# Patient Record
Sex: Female | Born: 1980 | Race: Black or African American | Hispanic: No | Marital: Single | Smoking: Former smoker
Health system: Southern US, Community
[De-identification: ages and names within clinical notes are randomized; demographics above are authoritative.]

## PROBLEM LIST (undated history)

## (undated) DIAGNOSIS — J45909 Unspecified asthma, uncomplicated: Secondary | ICD-10-CM

## (undated) DIAGNOSIS — I10 Essential (primary) hypertension: Secondary | ICD-10-CM

## (undated) DIAGNOSIS — E785 Hyperlipidemia, unspecified: Secondary | ICD-10-CM

## (undated) DIAGNOSIS — I340 Nonrheumatic mitral (valve) insufficiency: Secondary | ICD-10-CM

## (undated) DIAGNOSIS — K219 Gastro-esophageal reflux disease without esophagitis: Secondary | ICD-10-CM

## (undated) DIAGNOSIS — R87619 Unspecified abnormal cytological findings in specimens from cervix uteri: Secondary | ICD-10-CM

## (undated) DIAGNOSIS — R0600 Dyspnea, unspecified: Secondary | ICD-10-CM

## (undated) DIAGNOSIS — A599 Trichomoniasis, unspecified: Secondary | ICD-10-CM

## (undated) DIAGNOSIS — I519 Heart disease, unspecified: Secondary | ICD-10-CM

## (undated) DIAGNOSIS — R519 Headache, unspecified: Secondary | ICD-10-CM

## (undated) HISTORY — DX: Dyspnea, unspecified: R06.00

## (undated) HISTORY — DX: Unspecified abnormal cytological findings in specimens from cervix uteri: R87.619

## (undated) HISTORY — DX: Hyperlipidemia, unspecified: E78.5

## (undated) HISTORY — DX: Trichomoniasis, unspecified: A59.9

## (undated) HISTORY — PX: CHOLECYSTECTOMY: SHX55

## (undated) HISTORY — DX: Essential (primary) hypertension: I10

## (undated) HISTORY — DX: Unspecified asthma, uncomplicated: J45.909

## (undated) HISTORY — DX: Nonrheumatic mitral (valve) insufficiency: I34.0

## (undated) HISTORY — DX: Heart disease, unspecified: I51.9

---

## 2003-10-31 ENCOUNTER — Inpatient Hospital Stay (HOSPITAL_COMMUNITY): Admission: AD | Admit: 2003-10-31 | Discharge: 2003-11-02 | Payer: Self-pay | Admitting: Obstetrics & Gynecology

## 2013-08-24 DIAGNOSIS — E782 Mixed hyperlipidemia: Secondary | ICD-10-CM | POA: Insufficient documentation

## 2013-08-24 DIAGNOSIS — Z720 Tobacco use: Secondary | ICD-10-CM | POA: Insufficient documentation

## 2013-09-25 ENCOUNTER — Ambulatory Visit: Payer: Self-pay | Admitting: Internal Medicine

## 2013-11-20 HISTORY — PX: MITRAL VALVE REPLACEMENT: SHX147

## 2014-01-24 DIAGNOSIS — K219 Gastro-esophageal reflux disease without esophagitis: Secondary | ICD-10-CM | POA: Insufficient documentation

## 2015-05-23 ENCOUNTER — Encounter: Payer: Self-pay | Admitting: *Deleted

## 2015-05-27 ENCOUNTER — Encounter: Payer: Self-pay | Admitting: Obstetrics and Gynecology

## 2015-05-27 ENCOUNTER — Encounter: Payer: Self-pay | Admitting: *Deleted

## 2015-06-03 ENCOUNTER — Encounter: Payer: Self-pay | Admitting: Obstetrics & Gynecology

## 2015-06-03 ENCOUNTER — Ambulatory Visit (INDEPENDENT_AMBULATORY_CARE_PROVIDER_SITE_OTHER): Payer: Medicaid Other | Admitting: Obstetrics & Gynecology

## 2015-06-03 VITALS — BP 140/90 | HR 72 | Ht 67.0 in | Wt 172.0 lb

## 2015-06-03 DIAGNOSIS — R87611 Atypical squamous cells cannot exclude high grade squamous intraepithelial lesion on cytologic smear of cervix (ASC-H): Secondary | ICD-10-CM | POA: Diagnosis not present

## 2015-06-03 DIAGNOSIS — IMO0002 Reserved for concepts with insufficient information to code with codable children: Secondary | ICD-10-CM

## 2015-06-03 NOTE — Progress Notes (Signed)
Patient ID: Anne Kirby, female   DOB: [birth year], 35 y.o.   MRN: [id number] Colposcopy Procedure Note:  Colposcopy Procedure Note  Indications: Pap smear 1 months ago showed: ASC cannot exclude high grade lesion [facility]). The prior pap showed normal  Prior cervical/vaginal disease: . Prior cervical treatment: .  Smoker:  Yes.   New sexual partner:  No.  : time frame:    History of abnormal Pap: no  Procedure Details  The risks and benefits of the procedure and Written informed consent obtained.  Speculum placed in vagina and excellent visualization of cervix achieved, cervix swabbed x 3 with acetic acid solution.  Findings: Cervix: no visible lesions, no mosaicism, no punctation and no abnormal vasculature; no biopsies taken. Vaginal inspection: vaginal colposcopy not performed. Vulvar colposcopy: vulvar colposcopy not performed.  Specimens: none  Complications: none.  Plan: Follow-up Pap smear 1 year

## 2016-07-02 ENCOUNTER — Encounter: Payer: Self-pay | Admitting: Internal Medicine

## 2016-07-02 DIAGNOSIS — I4891 Unspecified atrial fibrillation: Secondary | ICD-10-CM | POA: Insufficient documentation

## 2016-07-02 DIAGNOSIS — G43909 Migraine, unspecified, not intractable, without status migrainosus: Secondary | ICD-10-CM | POA: Insufficient documentation

## 2016-07-02 DIAGNOSIS — I340 Nonrheumatic mitral (valve) insufficiency: Secondary | ICD-10-CM | POA: Insufficient documentation

## 2016-07-09 ENCOUNTER — Encounter: Payer: Self-pay | Admitting: Internal Medicine

## 2016-07-09 ENCOUNTER — Ambulatory Visit: Payer: Self-pay | Admitting: Internal Medicine

## 2016-07-09 DIAGNOSIS — S83411A Sprain of medial collateral ligament of right knee, initial encounter: Secondary | ICD-10-CM | POA: Insufficient documentation

## 2016-08-19 ENCOUNTER — Ambulatory Visit: Payer: Medicaid Other | Admitting: Family Medicine

## 2017-05-23 ENCOUNTER — Encounter: Payer: Self-pay | Admitting: *Deleted

## 2017-06-06 ENCOUNTER — Telehealth: Payer: Self-pay | Admitting: *Deleted

## 2017-06-06 ENCOUNTER — Encounter: Payer: Self-pay | Admitting: *Deleted

## 2017-06-06 NOTE — Telephone Encounter (Signed)
Called work number, pt at lunch. VM full on cell phone.

## 2017-06-06 NOTE — Telephone Encounter (Signed)
Patient called requesting antibiotic before colpo on 4/2. Has a mechanical valve and states takes antibiotics before any procedure.  Please advise.

## 2017-06-08 NOTE — Telephone Encounter (Signed)
This does not qualify, she will not require prophylaxis for this procedure

## 2017-06-09 NOTE — Telephone Encounter (Signed)
Informed pt that she will not need an antibiotic before her colposcopy. Pt verbalized understanding.

## 2017-06-21 ENCOUNTER — Encounter: Payer: Self-pay | Admitting: Obstetrics & Gynecology

## 2017-07-12 ENCOUNTER — Other Ambulatory Visit: Payer: Self-pay

## 2017-07-12 ENCOUNTER — Ambulatory Visit: Payer: Medicaid Other | Admitting: Obstetrics & Gynecology

## 2017-07-12 ENCOUNTER — Encounter: Payer: Self-pay | Admitting: Obstetrics & Gynecology

## 2017-07-12 VITALS — BP 142/90 | HR 92 | Ht 67.0 in | Wt 193.0 lb

## 2017-07-12 DIAGNOSIS — R87610 Atypical squamous cells of undetermined significance on cytologic smear of cervix (ASC-US): Secondary | ICD-10-CM

## 2017-07-12 DIAGNOSIS — N921 Excessive and frequent menstruation with irregular cycle: Secondary | ICD-10-CM

## 2017-07-12 DIAGNOSIS — D500 Iron deficiency anemia secondary to blood loss (chronic): Secondary | ICD-10-CM | POA: Diagnosis not present

## 2017-07-12 DIAGNOSIS — Z3202 Encounter for pregnancy test, result negative: Secondary | ICD-10-CM | POA: Diagnosis not present

## 2017-07-12 DIAGNOSIS — R87810 Cervical high risk human papillomavirus (HPV) DNA test positive: Secondary | ICD-10-CM | POA: Diagnosis not present

## 2017-07-12 LAB — POCT HEMOGLOBIN: Hemoglobin: 10.7 g/dL — AB (ref 12.2–16.2)

## 2017-07-12 LAB — POCT URINE PREGNANCY: Preg Test, Ur: NEGATIVE

## 2017-07-12 MED ORDER — MEGESTROL ACETATE 40 MG PO TABS: ORAL_TABLET | ORAL | 3 refills | Status: DC

## 2017-07-12 NOTE — Progress Notes (Signed)
Colposcopy Procedure Note:  Colposcopy Procedure Note  Indications: Pap smear 3 months ago showed: ASCUS with POSITIVE high risk HPV. The prior pap showed no abnormalities.  Prior cervical/vaginal disease: normal exam without visible pathology. Prior cervical treatment: no treatment.  Smoker:  No. New sexual partner:  No.  : time frame:  No.  History of abnormal Pap: no  Procedure Details  The risks and benefits of the procedure and written informed consent obtained.  Speculum placed in vagina and excellent visualization of cervix achieved, cervix swabbed x 3 with acetic acid solution.  Findings:Normal colposcopy, adequate Cervix: no visible lesions, no mosaicism, no punctation and no abnormal vasculature; SCJ visualized 360 degrees without lesions and no biopsies taken. Vaginal inspection: normal without visible lesions. Vulvar colposcopy: vulvar colposcopy not performed.  Specimens: none  Complications: none.  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 megestrol (MEGACE) 40 MG tablet    Sig: 3 tablets a day for 5 days, 2 tablets a day for 5 days then 1 tablet daily    Dispense:  45 tablet    Refill:  3   Orders Placed This Encounter  Procedures  . US PELVIS (TRANSABDOMINAL ONLY)  . US PELVIS TRANSVANGINAL NON-OB (TV ONLY)  . POCT urine pregnancy  . POCT hemoglobin

## 2017-07-26 ENCOUNTER — Telehealth: Payer: Self-pay | Admitting: *Deleted

## 2017-07-26 ENCOUNTER — Encounter: Payer: Self-pay | Admitting: *Deleted

## 2017-07-26 ENCOUNTER — Ambulatory Visit: Payer: Medicaid Other | Admitting: Obstetrics & Gynecology

## 2017-07-26 ENCOUNTER — Encounter: Payer: Self-pay | Admitting: Obstetrics & Gynecology

## 2017-07-26 ENCOUNTER — Ambulatory Visit (INDEPENDENT_AMBULATORY_CARE_PROVIDER_SITE_OTHER): Payer: Medicaid Other

## 2017-07-26 VITALS — BP 140/82 | HR 61 | Ht 67.0 in | Wt 191.0 lb

## 2017-07-26 DIAGNOSIS — D251 Intramural leiomyoma of uterus: Secondary | ICD-10-CM | POA: Diagnosis not present

## 2017-07-26 DIAGNOSIS — N921 Excessive and frequent menstruation with irregular cycle: Secondary | ICD-10-CM | POA: Diagnosis not present

## 2017-07-26 DIAGNOSIS — D250 Submucous leiomyoma of uterus: Secondary | ICD-10-CM

## 2017-07-26 DIAGNOSIS — Z7901 Long term (current) use of anticoagulants: Secondary | ICD-10-CM

## 2017-07-26 MED ORDER — LEUPROLIDE ACETATE 3.75 MG IM KIT: 3.7500 mg | PACK | Freq: Once | INTRAMUSCULAR | 0 refills | Status: AC

## 2017-07-26 NOTE — Progress Notes (Signed)
Follow up appointment for results  Chief Complaint  Patient presents with  . Follow-up    [place] today    Blood pressure 140/82, pulse 61, height '5\' 7"'  (1.702 m), weight 191 lb (86.6 kg).  US Pelvis Transvanginal Non-ob (tv Only)  Result Date: 07/26/2017 GYNECOLOGIC SONOGRAM Anne Kirby is a 37 y.o. G2P2 unknown LMP,she is here for a pelvic sonogram for abnormal uterine bleeding. Uterus                      8.7 x 5.1 x 6 cm, vol 140 ml,heterogeneous anteverted uterus w/mult.small fibroids Endometrium          8.4 mm, symmetrical, slightly distorted by small submucosal fibroid mid/right uterus Right ovary             2.7 x 2.6 x 2.5 cm, wnl Left ovary                3.9 x 1.8 x 2.8 cm, wnl Fibroids                  (#1) anterior intermural fibroid 20 x 12 x 22 mm cm (#2) submucosal fibroid mid/right, slightly distorting endometrium 10 x 7 x 8 mm No free fluid Technician Comments: PELVIC US TA/TV:heterogeneous anteverted uterus w/mult.small fibroids,(#1) anterior intermural fibroid 20 x 12 x 22 mm cm (#2) submucosal fibroid mid/right, slightly distorting endometrium 10 x 7 x 8 mm,EEC 8.4 mm,small amount of fluid w/in endometrium,normal ovaries bilat,ovaries appear mobile,no free fluid [employer] 07/26/2017 11:49 AM Clinical Impression and recommendations: I have reviewed the sonogram results above, combined with the patient's current clinical course, below are my impressions and any appropriate recommendations for management based on the sonographic findings. Uterus is normal, small inconsequential myomas Endometrium normal minimla distortion by the fundal submucosal myoma, unlikely to be contributing Both ovaries ar enormal Florian Buff 07/26/2017 12:10 PM   US Pelvis (transabdominal Only)  Result Date: 07/26/2017 GYNECOLOGIC SONOGRAM Anne Kirby is a 37 y.o. G2P2 unknown LMP,she is here for a pelvic sonogram for abnormal uterine bleeding. Uterus                      8.7 x 5.1 x 6 cm, vol 140  ml,heterogeneous anteverted uterus w/mult.small fibroids Endometrium          8.4 mm, symmetrical, slightly distorted by small submucosal fibroid mid/right uterus Right ovary             2.7 x 2.6 x 2.5 cm, wnl Left ovary                3.9 x 1.8 x 2.8 cm, wnl Fibroids                  (#1) anterior intermural fibroid 20 x 12 x 22 mm cm (#2) submucosal fibroid mid/right, slightly distorting endometrium 10 x 7 x 8 mm No free fluid Technician Comments: PELVIC US TA/TV:heterogeneous anteverted uterus w/mult.small fibroids,(#1) anterior intermural fibroid 20 x 12 x 22 mm cm (#2) submucosal fibroid mid/right, slightly distorting endometrium 10 x 7 x 8 mm,EEC 8.4 mm,small amount of fluid w/in endometrium,normal ovaries bilat,ovaries appear mobile,no free fluid [employer] 07/26/2017 11:49 AM Clinical Impression and recommendations: I have reviewed the sonogram results above, combined with the patient's current clinical course, below are my impressions and any appropriate recommendations for management based on the sonographic findings. Uterus is normal, small inconsequential myomas Endometrium normal minimla  distortion by the fundal submucosal myoma, unlikely to be contributing Both ovaries ar enormal Florian Buff 07/26/2017 12:10 PM      MEDS ordered this encounter: Meds ordered this encounter  Medications  . leuprolide (LUPRON) 3.75 MG injection    Sig: Inject 3.75 mg into the muscle once for 1 dose.    Dispense:  1 kit    Refill:  0    Orders for this encounter: No orders of the defined types were placed in this encounter.   Impression: Menorrhagia with irregular cycle  Chronic anticoagulation    Plan: Pt does want to preserve her fertility at this point, so she does not want to have an endometrial ablation  The megace has bee ineffective in controlling her bleeding  I will try 1 month of lupron injection and see if that will not restabilize the ednometrium thereafter, it is not conventional  but I really don't have any other options at this point  Follow Up: Return in about 1 month (around 08/26/2017) for Follow up, with Dr Elonda Husky.       Face to face time:  10 minutes  Greater than 50% of the visit time was spent in counseling and coordination of care with the patient.  The summary and outline of the counseling and care coordination is summarized in the note above.   All questions were answered.  Past Medical History:  Diagnosis Date  . Abnormal Pap smear of cervix   . Asthma   . Dyspnea   . Heart disease    cardial murmur  . Hyperlipidemia   . Hypertension   . Mitral regurgitation   . Trichomoniasis     Past Surgical History:  Procedure Laterality Date  . MITRAL VALVE REPLACEMENT  11/20/13    OB History    Gravida  2   Para  2   Term      Preterm      AB      Living        SAB      TAB      Ectopic      Multiple      Live Births              Allergies  Allergen Reactions  . Tramadol Hives    Social History   Socioeconomic History  . Marital status: Single    Spouse name: Not on file  . Number of children: Not on file  . Years of education: Not on file  . Highest education level: Not on file  Occupational History  . Not on file  Social Needs  . Financial resource strain: Not on file  . Food insecurity:    Worry: Not on file    Inability: Not on file  . Transportation needs:    Medical: Not on file    Non-medical: Not on file  Tobacco Use  . Smoking status: Current Every Day Smoker    Packs/day: 0.25    Years: 15.00    Pack years: 3.75    Types: Cigarettes  . Smokeless tobacco: Never Used  Substance and Sexual Activity  . Alcohol use: No  . Drug use: No  . Sexual activity: Yes    Birth control/protection: None  Lifestyle  . Physical activity:    Days per week: Not on file    Minutes per session: Not on file  . Stress: Not on file  Relationships  . Social connections:    Talks on  phone: Not on file    Gets  together: Not on file    Attends religious service: Not on file    Active member of club or organization: Not on file    Attends meetings of clubs or organizations: Not on file    Relationship status: Not on file  Other Topics Concern  . Not on file  Social History Narrative  . Not on file    Family History  Problem Relation Age of Onset  . Diabetes Mother   . Heart disease Father   . Heart attack Father   . Hypertension Other   . Stroke Other

## 2017-07-26 NOTE — Progress Notes (Signed)
PELVIC US TA/TV:heterogeneous anteverted uterus w/mult.small fibroids,(#1) anterior intermural fibroid 20 x 12 x 22 mm cm (#2) submucosal fibroid mid/right, slightly distorting endometrium 10 x 7 x 8 mm,EEC 8.4 mm,small amount of fluid w/in endometrium,normal ovaries bilat,ovaries appear mobile,no free fluid

## 2017-07-26 NOTE — Telephone Encounter (Signed)
Pt wants note stating that she needs a couple days out of work due to heavy bleeding. Pt discussed and note was given.  07-26-17  AS

## 2017-08-25 ENCOUNTER — Ambulatory Visit: Payer: Medicaid Other | Admitting: Obstetrics & Gynecology

## 2017-08-25 ENCOUNTER — Encounter: Payer: Self-pay | Admitting: Obstetrics & Gynecology

## 2017-08-25 VITALS — BP 128/88 | HR 89 | Ht 67.0 in | Wt 192.0 lb

## 2017-08-25 DIAGNOSIS — Z7901 Long term (current) use of anticoagulants: Secondary | ICD-10-CM

## 2017-08-25 DIAGNOSIS — N921 Excessive and frequent menstruation with irregular cycle: Secondary | ICD-10-CM

## 2017-08-25 NOTE — Progress Notes (Signed)
Chief Complaint  Patient presents with  . Follow-up      37 y.o. G2P2 Patient's last menstrual period was 08/20/2017. The current method of family planning is none.  Outpatient Encounter Medications as of 08/25/2017  Medication Sig  . albuterol (PROVENTIL HFA;VENTOLIN HFA) 108 (90 Base) MCG/ACT inhaler Inhale 2 puffs into the lungs every 4 (four) hours as needed for wheezing or shortness of breath.  Marland Kitchen albuterol (PROVENTIL) (2.5 MG/3ML) 0.083% nebulizer solution Take 2.5 mg by nebulization 3 (three) times daily.  Marland Kitchen atorvastatin (LIPITOR) 40 MG tablet Take 40 mg by mouth daily.  . cyclobenzaprine (FLEXERIL) 10 MG tablet Take 10 mg by mouth 3 (three) times daily as needed for muscle spasms.  Marland Kitchen FLUTICASONE PROPIONATE, INHAL, IN Inhale into the lungs daily.  Marland Kitchen gabapentin (NEURONTIN) 300 MG capsule Take 600 mg by mouth 3 (three) times daily.  . megestrol (MEGACE) 40 MG tablet 3 tablets a day for 5 days, 2 tablets a day for 5 days then 1 tablet daily  . pantoprazole (PROTONIX) 40 MG tablet Take 40 mg by mouth daily.  . SUMAtriptan (IMITREX) 100 MG tablet Take 100 mg by mouth every 2 (two) hours as needed for migraine. May repeat in 2 hours if headache persists or recurs.  . warfarin (COUMADIN) 4 MG tablet Take 4 mg by mouth. Takes 2 tabs once a day 6 days a week  . warfarin (COUMADIN) 5 MG tablet Take 5 mg by mouth. Takes 1 tab nightly along with one 4 mg on the 7th day.   No facility-administered encounter medications on file as of 08/25/2017.     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 Abnormal Pap smear of cervix   . Asthma     . Dyspnea   . Heart disease    cardial murmur  . Hyperlipidemia   . Hypertension   . Mitral regurgitation   . Trichomoniasis     Past Surgical History:  Procedure Laterality Date  . MITRAL VALVE REPLACEMENT  11/20/13    OB History    Gravida  2   Para  2   Term      Preterm      AB      Living        SAB      TAB      Ectopic      Multiple      Live Births              Allergies  Allergen Reactions  . Tramadol Hives    Social History   Socioeconomic History  . Marital status: Single    Spouse name: Not on file  . Number of children: Not on file  . Years of education: Not on file  . Highest education level: Not on file  Occupational History  . Not on file  Social Needs  . Financial resource strain: Not on file  . Food insecurity:    Worry: Not on file    Inability: Not on file  . Transportation needs:    Medical: Not on file    Non-medical: Not on  file  Tobacco Use  . Smoking status: Current Every Day Smoker    Packs/day: 0.25    Years: 15.00    Pack years: 3.75    Types: Cigarettes  . Smokeless tobacco: Never Used  Substance and Sexual Activity  . Alcohol use: No  . Drug use: No  . Sexual activity: Yes    Birth control/protection: None  Lifestyle  . Physical activity:    Days per week: Not on file    Minutes per session: Not on file  . Stress: Not on file  Relationships  . Social connections:    Talks on phone: Not on file    Gets together: Not on file    Attends religious service: Not on file    Active member of club or organization: Not on file    Attends meetings of clubs or organizations: Not on file    Relationship status: Not on file  Other Topics Concern  . Not on file  Social History Narrative  . Not on file    Family History  Problem Relation Age of Onset  . Diabetes Mother   . Heart disease Father   . Heart attack Father   . Hypertension Other   . Stroke Other     Medications:       Current Outpatient  Medications:  .  albuterol (PROVENTIL HFA;VENTOLIN HFA) 108 (90 Base) MCG/ACT inhaler, Inhale 2 puffs into the lungs every 4 (four) hours as needed for wheezing or shortness of breath., Disp: , Rfl:  .  albuterol (PROVENTIL) (2.5 MG/3ML) 0.083% nebulizer solution, Take 2.5 mg by nebulization 3 (three) times daily., Disp: , Rfl:  .  atorvastatin (LIPITOR) 40 MG tablet, Take 40 mg by mouth daily., Disp: , Rfl:  .  cyclobenzaprine (FLEXERIL) 10 MG tablet, Take 10 mg by mouth 3 (three) times daily as needed for muscle spasms., Disp: , Rfl:  .  FLUTICASONE PROPIONATE, INHAL, IN, Inhale into the lungs daily., Disp: , Rfl:  .  gabapentin (NEURONTIN) 300 MG capsule, Take 600 mg by mouth 3 (three) times daily., Disp: , Rfl:  .  megestrol (MEGACE) 40 MG tablet, 3 tablets a day for 5 days, 2 tablets a day for 5 days then 1 tablet daily, Disp: 45 tablet, Rfl: 3 .  pantoprazole (PROTONIX) 40 MG tablet, Take 40 mg by mouth daily., Disp: , Rfl:  .  SUMAtriptan (IMITREX) 100 MG tablet, Take 100 mg by mouth every 2 (two) hours as needed for migraine. May repeat in 2 hours if headache persists or recurs., Disp: , Rfl:  .  warfarin (COUMADIN) 4 MG tablet, Take 4 mg by mouth. Takes 2 tabs once a day 6 days a week, Disp: , Rfl:  .  warfarin (COUMADIN) 5 MG tablet, Take 5 mg by mouth. Takes 1 tab nightly along with one 4 mg on the 7th day., Disp: , Rfl:   Objective Blood pressure 128/88, pulse 89, height 5\' 7"  (1.702 m), weight 192 lb (87.1 kg), last menstrual period 08/20/2017.  Gen WDWN NAD  Pertinent ROS No burning with urination, frequency or urgency No nausea, vomiting or diarrhea Nor fever chills or other constitutional symptoms   Labs or studies    Impression Diagnoses this Encounter::   ICD-10-CM   1. Menorrhagia with irregular cycle N92.1   2. Chronic anticoagulation Z79.01     Established relevant diagnosis(es):   Plan/Recommendations: No orders of the defined types were placed in this  encounter.   Labs or  Scans Ordered: No orders of the defined types were placed in this encounter.   Management:: Will try to conceive Pt aware that if her problematic bleeding returns she may require surgical management  Follow up Return if symptoms worsen or fail to improve.        Face to face time:  15 minutes  Greater than 50% of the visit time was spent in counseling and coordination of care with the patient.  The summary and outline of the counseling and care coordination is summarized in the note above.   All questions were answered.

## 2017-10-17 ENCOUNTER — Telehealth: Payer: Self-pay | Admitting: Obstetrics & Gynecology

## 2017-10-17 NOTE — Telephone Encounter (Signed)
Pt stopped megace in May. She has not had a period in 8 weeks. She stated that she could be pregnant. I advised that she could take a test to see what is going on. She also is scheduled for followup on Friday. She can also discuss her concerns at that time. Patient agreeable, no further questions at this time.

## 2017-10-21 ENCOUNTER — Encounter: Payer: Self-pay | Admitting: Obstetrics & Gynecology

## 2017-10-21 ENCOUNTER — Other Ambulatory Visit: Payer: Self-pay

## 2017-10-21 ENCOUNTER — Ambulatory Visit: Payer: Medicaid Other | Admitting: Obstetrics & Gynecology

## 2017-10-21 VITALS — BP 153/107 | HR 94 | Ht 66.0 in | Wt 197.0 lb

## 2017-10-21 DIAGNOSIS — Z3202 Encounter for pregnancy test, result negative: Secondary | ICD-10-CM | POA: Diagnosis not present

## 2017-10-21 DIAGNOSIS — N921 Excessive and frequent menstruation with irregular cycle: Secondary | ICD-10-CM | POA: Diagnosis not present

## 2017-10-21 DIAGNOSIS — N912 Amenorrhea, unspecified: Secondary | ICD-10-CM | POA: Diagnosis not present

## 2017-10-21 DIAGNOSIS — Z7901 Long term (current) use of anticoagulants: Secondary | ICD-10-CM

## 2017-10-21 LAB — POCT URINE PREGNANCY: Preg Test, Ur: NEGATIVE

## 2017-10-21 NOTE — Progress Notes (Signed)
Chief Complaint  Patient presents with  . Amenorrhea    no period in 7 weeks after taking shot and pills       37 y.o. G2P2 Patient's last menstrual period was 08/26/2017. The current method of family planning is none.  Outpatient Encounter Medications as of 10/21/2017  Medication Sig  . albuterol (PROVENTIL HFA;VENTOLIN HFA) 108 (90 Base) MCG/ACT inhaler Inhale 2 puffs into the lungs every 4 (four) hours as needed for wheezing or shortness of breath.  Marland Kitchen albuterol (PROVENTIL) (2.5 MG/3ML) 0.083% nebulizer solution Take 2.5 mg by nebulization 3 (three) times daily.  Marland Kitchen amLODipine (NORVASC) 10 MG tablet Take by mouth.  Marland Kitchen atorvastatin (LIPITOR) 40 MG tablet Take 40 mg by mouth daily.  . cyclobenzaprine (FLEXERIL) 10 MG tablet Take 10 mg by mouth 3 (three) times daily as needed for muscle spasms.  Marland Kitchen FLUTICASONE PROPIONATE, INHAL, IN Inhale into the lungs daily.  Marland Kitchen gabapentin (NEURONTIN) 300 MG capsule Take 600 mg by mouth 2 (two) times daily.   . pantoprazole (PROTONIX) 40 MG tablet Take 40 mg by mouth daily.  . SUMAtriptan (IMITREX) 100 MG tablet Take 100 mg by mouth every 2 (two) hours as needed for migraine. May repeat in 2 hours if headache persists or recurs.  . warfarin (COUMADIN) 4 MG tablet Take 4 mg by mouth. Takes 2 tabs once a day 6 days a week  . warfarin (COUMADIN) 5 MG tablet Take 5 mg by mouth. Takes 1 tab nightly along with one 4 mg on the 7th day.  . megestrol (MEGACE) 40 MG tablet 3 tablets a day for 5 days, 2 tablets a day for 5 days then 1 tablet daily (Patient not taking: Reported on 10/21/2017)   No facility-administered encounter medications on file as of 10/21/2017.     Subjective Anne Kirby presented to the office because she has not bled in over 7 weeks It is noted from previous encounters that the pt is anti coagulated had an INR of 5.7 and I exhausted all non surgical measures to get her bleeding stopped here in the past few months. Please see notes  from 5/7 and 6/06, ultimately had to give her lupron to get it stopped.  I misinterpreted to some degree why she was here today and that is my fault completely. I entered the room and said, "I know you can't be here to have a visit on not bleeding when it took everything I had to get your bleeding stopped." I was of course joking, however the patient was offended for me saying that.  I knew she is wanting to get pregnant going forward but has complicating issues related to her age and chronic anticoagulation.  I didn't realize directly that it was more related to the wanting to be pregnant issue and not the heavy prolonged bleeding issues.  So understandably the pt got very upset with my statement.  I apologized several times but the patient was too upset to accept my apology.  I did explain that sometimes after lupron it may take a while for the pituitary hypothalamic ovarian feedbacks to get back started and offered to give her provera for 10 days which she declined.  She stated the way I approached her today was unprofessional and she was offended.  I told her I was joking that it was ironic she was here to discuss not bleeding when I went to such conservative extremes to get her bleeding stopped just a couple of  months ago and that I did not intend to minimize her desire for a pregnancy.  The patient essentially said that by doing that she did not want to see me or this office ever again and I told her I understood and again was sorry for the misunderstanding.  I did discuss [place] with the patient given her age and complex medical issues as well as provera which she declined.  She did take the information I gave her regarding [place] Past Medical History:  Diagnosis Date  . Abnormal Pap smear of cervix   . Asthma   . Dyspnea   . Heart disease    cardial murmur  . Hyperlipidemia   . Hypertension   . Mitral regurgitation   . Trichomoniasis     Past Surgical History:  Procedure Laterality Date  .  MITRAL VALVE REPLACEMENT  11/20/13    OB History    Gravida  2   Para  2   Term      Preterm      AB      Living        SAB      TAB      Ectopic      Multiple      Live Births              Allergies  Allergen Reactions  . Tramadol Hives    Social History   Socioeconomic History  . Marital status: Single    Spouse name: Not on file  . Number of children: Not on file  . Years of education: Not on file  . Highest education level: Not on file  Occupational History  . Not on file  Social Needs  . Financial resource strain: Not on file  . Food insecurity:    Worry: Not on file    Inability: Not on file  . Transportation needs:    Medical: Not on file    Non-medical: Not on file  Tobacco Use  . Smoking status: Current Every Day Smoker    Packs/day: 0.25    Years: 15.00    Pack years: 3.75    Types: Cigarettes  . Smokeless tobacco: Never Used  Substance and Sexual Activity  . Alcohol use: No  . Drug use: No  . Sexual activity: Yes    Birth control/protection: None  Lifestyle  . Physical activity:    Days per week: Not on file    Minutes per session: Not on file  . Stress: Not on file  Relationships  . Social connections:    Talks on phone: Not on file    Gets together: Not on file    Attends religious service: Not on file    Active member of club or organization: Not on file    Attends meetings of clubs or organizations: Not on file    Relationship status: Not on file  Other Topics Concern  . Not on file  Social History Narrative  . Not on file    Family History  Problem Relation Age of Onset  . Diabetes Mother   . Heart disease Father   . Heart attack Father   . Hypertension Other   . Stroke Other     Medications:       Current Outpatient Medications:  .  albuterol (PROVENTIL HFA;VENTOLIN HFA) 108 (90 Base) MCG/ACT inhaler, Inhale 2 puffs into the lungs every 4 (four) hours as needed for wheezing or shortness of breath., Disp: ,  Rfl:  .  albuterol (PROVENTIL) (2.5 MG/3ML) 0.083% nebulizer solution, Take 2.5 mg by nebulization 3 (three) times daily., Disp: , Rfl:  .  amLODipine (NORVASC) 10 MG tablet, Take by mouth., Disp: , Rfl:  .  atorvastatin (LIPITOR) 40 MG tablet, Take 40 mg by mouth daily., Disp: , Rfl:  .  cyclobenzaprine (FLEXERIL) 10 MG tablet, Take 10 mg by mouth 3 (three) times daily as needed for muscle spasms., Disp: , Rfl:  .  FLUTICASONE PROPIONATE, INHAL, IN, Inhale into the lungs daily., Disp: , Rfl:  .  gabapentin (NEURONTIN) 300 MG capsule, Take 600 mg by mouth 2 (two) times daily. , Disp: , Rfl:  .  pantoprazole (PROTONIX) 40 MG tablet, Take 40 mg by mouth daily., Disp: , Rfl:  .  SUMAtriptan (IMITREX) 100 MG tablet, Take 100 mg by mouth every 2 (two) hours as needed for migraine. May repeat in 2 hours if headache persists or recurs., Disp: , Rfl:  .  warfarin (COUMADIN) 4 MG tablet, Take 4 mg by mouth. Takes 2 tabs once a day 6 days a week, Disp: , Rfl:  .  warfarin (COUMADIN) 5 MG tablet, Take 5 mg by mouth. Takes 1 tab nightly along with one 4 mg on the 7th day., Disp: , Rfl:  .  megestrol (MEGACE) 40 MG tablet, 3 tablets a day for 5 days, 2 tablets a day for 5 days then 1 tablet daily (Patient not taking: Reported on 10/21/2017), Disp: 45 tablet, Rfl: 3  Objective Blood pressure (!) 153/107, pulse 94, height 5\' 6"  (1.676 m), weight 197 lb (89.4 kg), last menstrual period 08/26/2017.  Gen WDWN NAD  Pertinent ROS No burning with urination, frequency or urgency No nausea, vomiting or diarrhea Nor fever chills or other constitutional symptoms   Labs or studies UPT negative    Impression Diagnoses this Encounter::   ICD-10-CM   1. Amenorrhea N91.2   2. Pregnancy examination or test, negative result Z32.02 POCT urine pregnancy  3. Menorrhagia with irregular cycle N92.1   4. Chronic anticoagulation Z79.01     Established relevant diagnosis(es):   Plan/Recommendations: No orders of the  defined types were placed in this encounter.   Labs or Scans Ordered: Orders Placed This Encounter  Procedures  . POCT urine pregnancy    Management:: I gave the patient a referral to [facility], writing down their number and showing her their website and she seems interested in seeking their evaluation and input  I offered her to send in a prescription for provera 10 mg x 10 days to jump start her cycles, if not for now then down the road, she declined  She stated she would not be coming back to our office because I was rude in the way I approached her initially.  Again I did apologize several times for initially  misinterpreting her reason for her visit but the patient was too upset to accept it, which I told her I understood  Follow up Return if symptoms worsen or fail to improve.     Face to face time:  10 minutes  Greater than 50% of the visit time was spent in counseling and coordination of care with the patient.  The summary and outline of the counseling and care coordination is summarized in the note above.   All questions were answered.

## 2017-10-26 ENCOUNTER — Ambulatory Visit: Payer: Medicaid Other | Admitting: Obstetrics and Gynecology

## 2019-05-13 ENCOUNTER — Other Ambulatory Visit: Payer: Self-pay

## 2019-05-13 ENCOUNTER — Emergency Department (HOSPITAL_COMMUNITY): Payer: Medicaid Other

## 2019-05-13 ENCOUNTER — Emergency Department (HOSPITAL_COMMUNITY)
Admission: EM | Admit: 2019-05-13 | Discharge: 2019-05-13 | Disposition: A | Discharge status: Home / Self Care | Payer: Medicaid Other | Attending: Emergency Medicine | Admitting: Emergency Medicine

## 2019-05-13 ENCOUNTER — Encounter (HOSPITAL_COMMUNITY): Payer: Self-pay | Admitting: Emergency Medicine

## 2019-05-13 DIAGNOSIS — Z79899 Other long term (current) drug therapy: Secondary | ICD-10-CM | POA: Diagnosis not present

## 2019-05-13 DIAGNOSIS — I10 Essential (primary) hypertension: Secondary | ICD-10-CM | POA: Insufficient documentation

## 2019-05-13 DIAGNOSIS — N3001 Acute cystitis with hematuria: Secondary | ICD-10-CM | POA: Insufficient documentation

## 2019-05-13 DIAGNOSIS — Z952 Presence of prosthetic heart valve: Secondary | ICD-10-CM | POA: Insufficient documentation

## 2019-05-13 DIAGNOSIS — R1030 Lower abdominal pain, unspecified: Secondary | ICD-10-CM | POA: Insufficient documentation

## 2019-05-13 DIAGNOSIS — R1110 Vomiting, unspecified: Secondary | ICD-10-CM | POA: Diagnosis present

## 2019-05-13 DIAGNOSIS — F17210 Nicotine dependence, cigarettes, uncomplicated: Secondary | ICD-10-CM | POA: Diagnosis not present

## 2019-05-13 DIAGNOSIS — Z7901 Long term (current) use of anticoagulants: Secondary | ICD-10-CM | POA: Diagnosis not present

## 2019-05-13 LAB — COMPREHENSIVE METABOLIC PANEL
ALT: 19 U/L (ref 0–44)
AST: 22 U/L (ref 15–41)
Albumin: 4.5 g/dL (ref 3.5–5.0)
Alkaline Phosphatase: 84 U/L (ref 38–126)
Anion gap: 10 (ref 5–15)
BUN: 12 mg/dL (ref 6–20)
CO2: 27 mmol/L (ref 22–32)
Calcium: 9.4 mg/dL (ref 8.9–10.3)
Chloride: 97 mmol/L — ABNORMAL LOW (ref 98–111)
Creatinine, Ser: 0.8 mg/dL (ref 0.44–1.00)
GFR calc Af Amer: >60 mL/min (ref >60)
GFR calc non Af Amer: >60 mL/min (ref >60)
Glucose, Bld: 113 mg/dL — ABNORMAL HIGH (ref 70–99)
Potassium: 3.2 mmol/L — ABNORMAL LOW (ref 3.5–5.1)
Sodium: 134 mmol/L — ABNORMAL LOW (ref 135–145)
Total Bilirubin: 1 mg/dL (ref 0.3–1.2)
Total Protein: 8.1 g/dL (ref 6.5–8.1)

## 2019-05-13 LAB — CBC WITH DIFFERENTIAL/PLATELET
Abs Immature Granulocytes: 0.04 10*3/uL (ref 0.00–0.07)
Basophils Absolute: 0 10*3/uL (ref 0.0–0.1)
Basophils Relative: 0 %
Eosinophils Absolute: 0 10*3/uL (ref 0.0–0.5)
Eosinophils Relative: 0 %
HCT: 42.4 % (ref 36.0–46.0)
Hemoglobin: 13.8 g/dL (ref 12.0–15.0)
Immature Granulocytes: 0 %
Lymphocytes Relative: 33 %
Lymphs Abs: 4.1 10*3/uL — ABNORMAL HIGH (ref 0.7–4.0)
MCH: 30.8 pg (ref 26.0–34.0)
MCHC: 32.5 g/dL (ref 30.0–36.0)
MCV: 94.6 fL (ref 80.0–100.0)
Monocytes Absolute: 1 10*3/uL (ref 0.1–1.0)
Monocytes Relative: 8 %
Neutro Abs: 7.2 10*3/uL (ref 1.7–7.7)
Neutrophils Relative %: 59 %
Platelets: 410 10*3/uL — ABNORMAL HIGH (ref 150–400)
RBC: 4.48 MIL/uL (ref 3.87–5.11)
RDW: 14.1 % (ref 11.5–15.5)
WBC: 12.4 10*3/uL — ABNORMAL HIGH (ref 4.0–10.5)
nRBC: 0 % (ref 0.0–0.2)

## 2019-05-13 LAB — URINALYSIS, ROUTINE W REFLEX MICROSCOPIC
Bilirubin Urine: NEGATIVE
Glucose, UA: NEGATIVE mg/dL
Ketones, ur: NEGATIVE mg/dL
Leukocytes,Ua: NEGATIVE
Nitrite: NEGATIVE
Protein, ur: 30 mg/dL — AB
RBC / HPF: >50 RBC/hpf — ABNORMAL HIGH (ref 0–5)
Specific Gravity, Urine: >1.046 — ABNORMAL HIGH (ref 1.005–1.030)
pH: 6 (ref 5.0–8.0)

## 2019-05-13 LAB — HCG, QUANTITATIVE, PREGNANCY: hCG, Beta Chain, Quant, S: <1 m[IU]/mL (ref <5)

## 2019-05-13 IMAGING — CT CT ABD-PELV W/ CM
2 of 5 series · 16 of 46 positions shown, 18 images · IV contrast (Omnipaque or Isovue)
Comparison: None.

CLINICAL DATA: Acute abdominal pain

EXAM:
CT ABDOMEN AND PELVIS WITH CONTRAST
TECHNIQUE: Multidetector CT imaging of the abdomen and pelvis was performed
using the standard protocol following bolus administration of
intravenous contrast.
CONTRAST:  100mL OMNIPAQUE IOHEXOL 300 MG/ML  SOLN

[Series 2: axial st · axial · 0.75mm/px · z∈[-686,-306]mm · 13 of 90 slices shown, 15 images]
[im 7/90  soft-tissue]
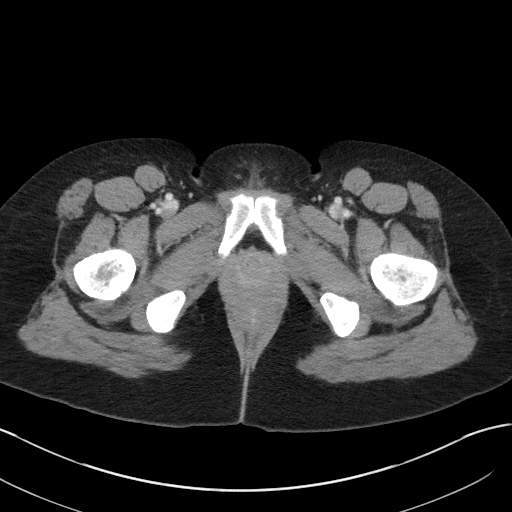
[im 7/90  bone]
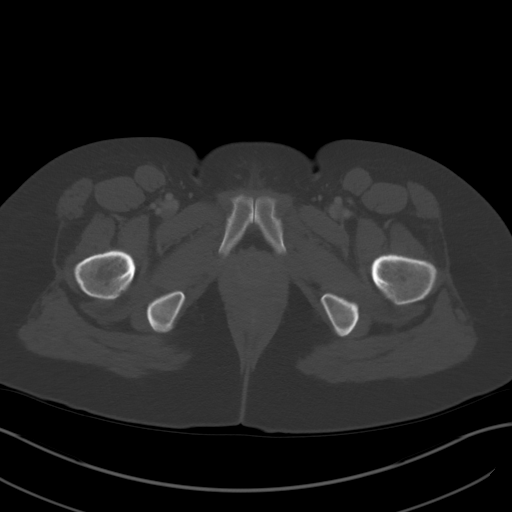
[im 13/90  soft-tissue]
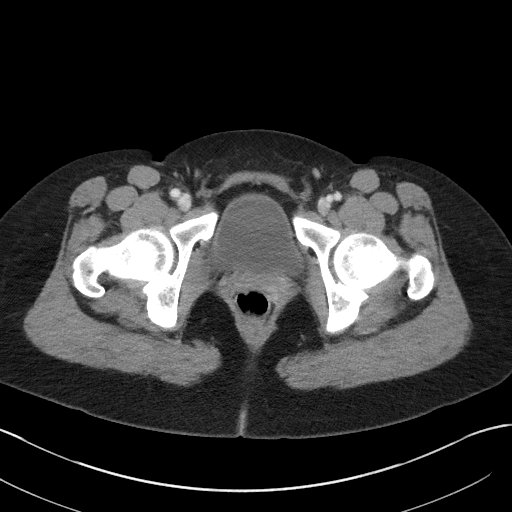
[im 20/90  soft-tissue]
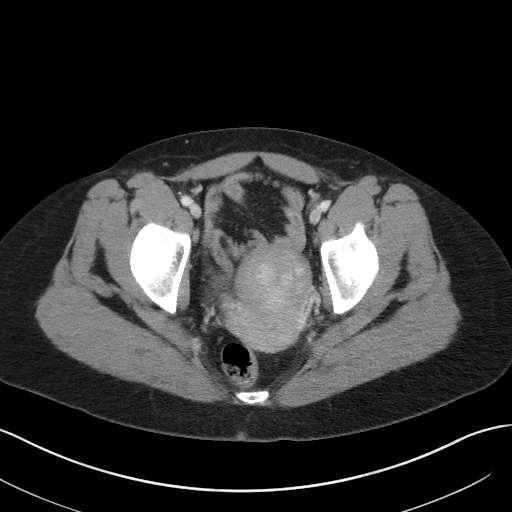
[im 26/90  soft-tissue]
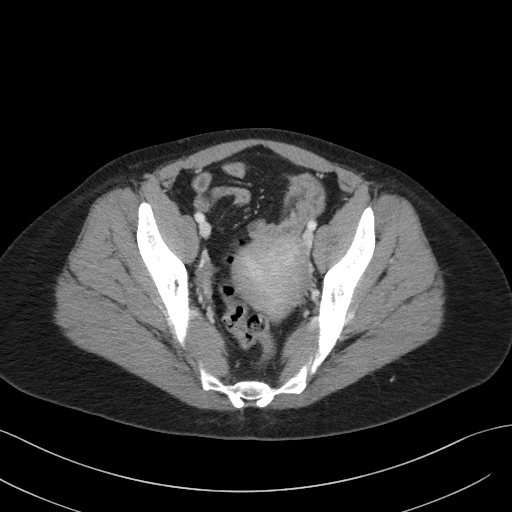
[im 32/90  soft-tissue]
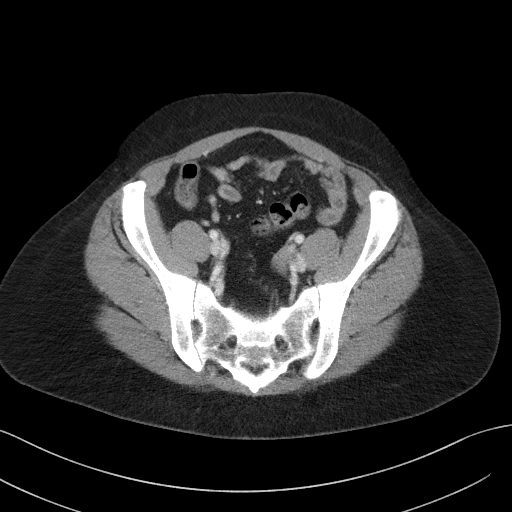
[im 39/90  soft-tissue]
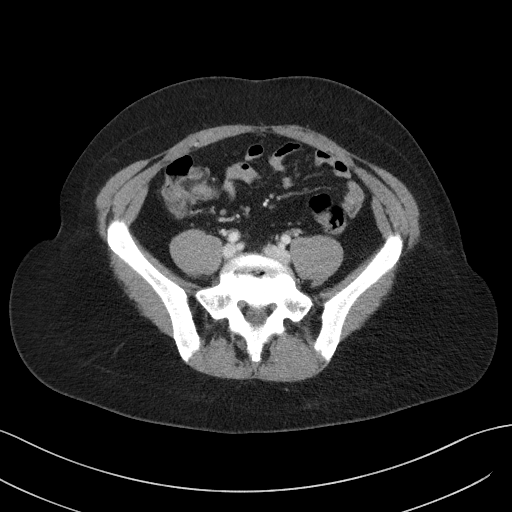
[im 45/90  soft-tissue]
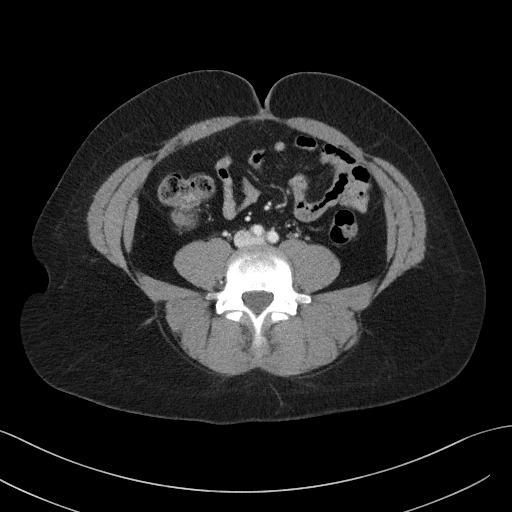
[im 51/90  soft-tissue]
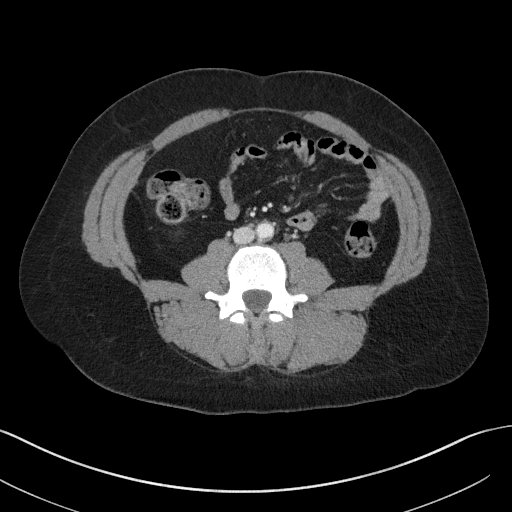
[im 58/90  soft-tissue]
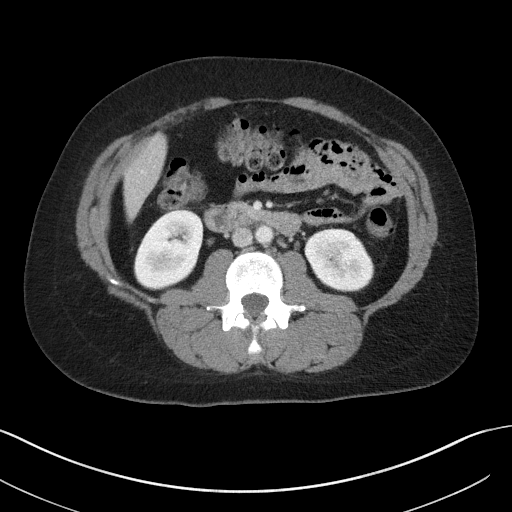
[im 58/90  bone]
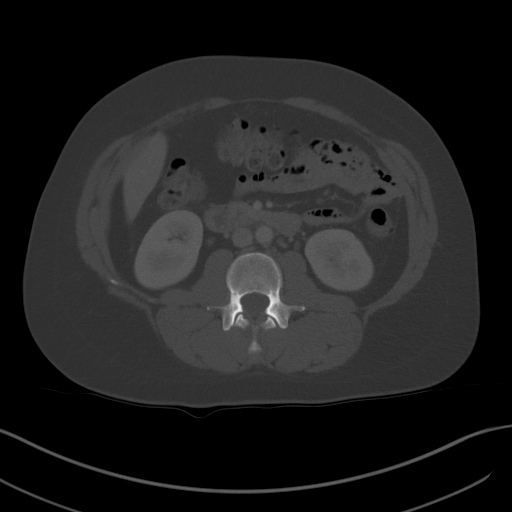
[im 64/90  soft-tissue]
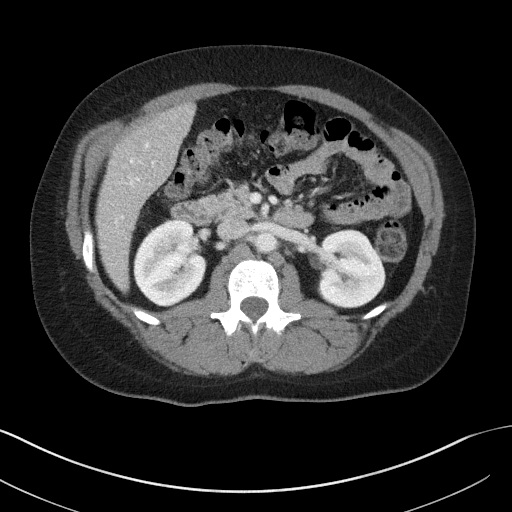
[im 70/90  soft-tissue]
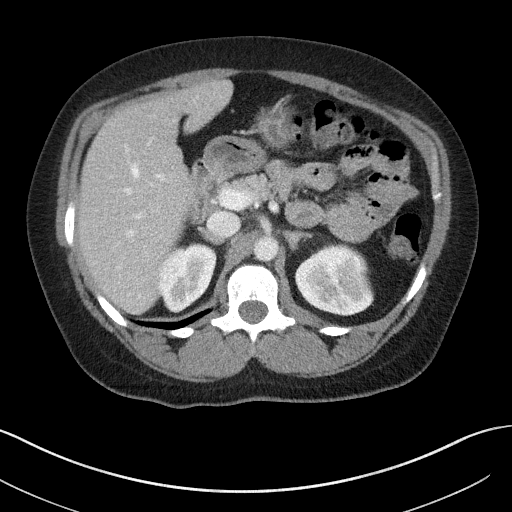
[im 77/90  soft-tissue]
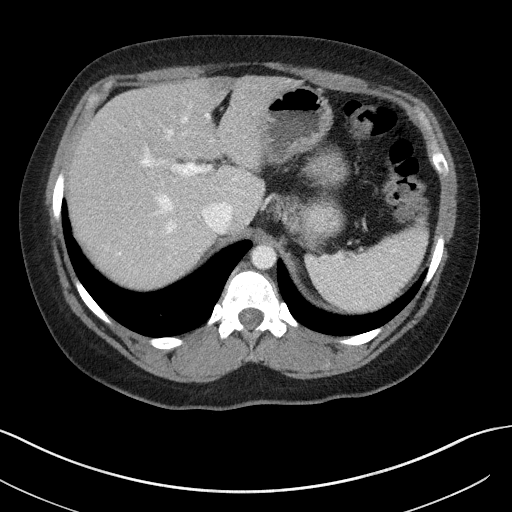
[im 83/90  soft-tissue]
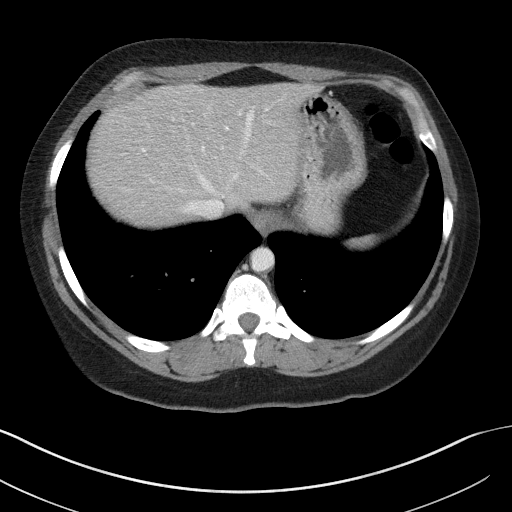

[Series 5: coronal st · coronal · 0.73mm/px · 3 of 101 slices shown]
[im 34/101  soft-tissue]
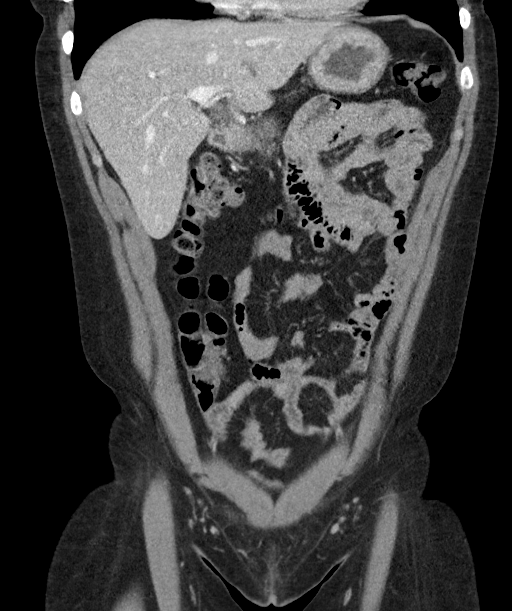
[im 45/101  soft-tissue]
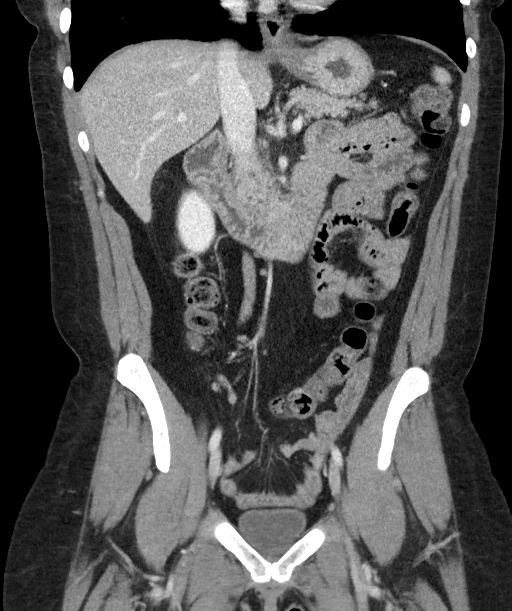
[im 56/101  soft-tissue]
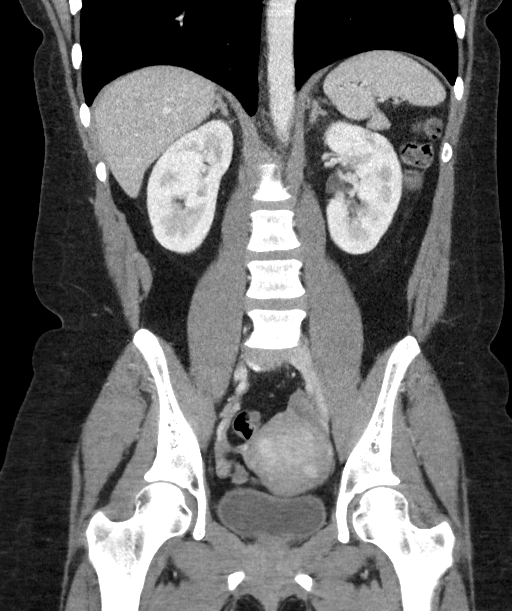

[16 of 46 positions shown; findings below may reference images not displayed]

FINDINGS: Lower chest: Lung bases are clear.

Hepatobiliary: Liver is within normal limits, noting focal
fat/altered perfusion along the falciform ligament.

Status post cholecystectomy. No intrahepatic or extrahepatic ductal
dilatation.

Pancreas: Within normal limits.

Spleen: Within normal limits.

Adrenals/Urinary Tract: Adrenal glands are within normal limits.

7 mm anterior left upper pole renal cyst (series 2/image 23).

Wedge-shaped hypoperfusion involving the lateral left upper kidney
(series 7/image 11), most prominent on the delayed imaging. This
appearance is nonspecific but at least raises the possibility of
pyelonephritis.

Bladder is within normal limits.

Stomach/Bowel: Stomach is within normal limits.

No evidence of bowel obstruction.

Normal appendix (series 2/image 58).

Vascular/Lymphatic: No evidence of abdominal aortic aneurysm.

No suspicious abdominopelvic lymphadenopathy.

Reproductive: Heterogeneous appearance of the uterus, suggesting
uterine fibroids.

Left ovary is within normal limits.

2.2 cm right ovarian cyst/follicle (series 2/image 68), likely
reflecting a benign corpus luteum.

Other: No abdominopelvic ascites.

Musculoskeletal: Mild degenerative changes at L5-S1.
IMPRESSION: Wedge-shaped hypoperfusion involving the lateral left upper kidney,
nonspecific but raising the possibility of pyelonephritis. Correlate
with laboratory evaluation and clinical exam.

No evidence of bowel obstruction.  Normal appendix.

Uterine fibroids.

Prior cholecystectomy.

## 2019-05-13 MED ORDER — PROMETHAZINE HCL 25 MG RE SUPP: 25.0000 mg | Freq: Four times a day (QID) | RECTAL | 0 refills | Status: DC | PRN

## 2019-05-13 MED ORDER — SODIUM CHLORIDE 0.9 % IV SOLN
1.0000 g | Freq: Once | INTRAVENOUS | Status: AC
  Administered 2019-05-13: 1 g via INTRAVENOUS
  Filled 2019-05-13: qty 10

## 2019-05-13 MED ORDER — CEPHALEXIN 500 MG PO CAPS: 500.0000 mg | ORAL_CAPSULE | Freq: Four times a day (QID) | ORAL | 0 refills | Status: DC

## 2019-05-13 MED ORDER — SODIUM CHLORIDE 0.9 % IV BOLUS
1000.0000 mL | Freq: Once | INTRAVENOUS | Status: AC
  Administered 2019-05-13: 10:00:00 1000 mL via INTRAVENOUS

## 2019-05-13 MED ORDER — IOHEXOL 300 MG/ML  SOLN
100.0000 mL | Freq: Once | INTRAMUSCULAR | Status: AC | PRN
  Administered 2019-05-13: 100 mL via INTRAVENOUS

## 2019-05-13 MED ORDER — SODIUM CHLORIDE 0.9 % IV BOLUS
1000.0000 mL | Freq: Once | INTRAVENOUS | Status: AC
  Administered 2019-05-13: 12:00:00 1000 mL via INTRAVENOUS

## 2019-05-13 NOTE — ED Triage Notes (Signed)
Pt reports having abd cramping and vomiting since Monday. Was seen at [facility] ER Wednesday and given phenergan suppositories and zofran. Does smoke marijuana but has not smoked in 2 weeks due to drug testing at work.

## 2019-05-13 NOTE — Discharge Instructions (Signed)
Drink plenty of fluids and follow-up with your doctor in 1 to 2 weeks for recheck.  Return if any problem

## 2019-05-13 NOTE — ED Provider Notes (Signed)
[facility] EMERGENCY DEPARTMENT Provider Note   CSN: [id number] Arrival date & time: 05/13/19  0935     History Chief Complaint  Patient presents with  . Emesis    Anne Kirby is a 39 y.o. female.  Patient complains of suprapubic pain and vomiting.  The history is provided by the patient. No language interpreter was used.  Emesis Severity:  Mild Timing:  Constant Quality:  Bilious material Able to tolerate:  Liquids Progression:  Worsening Chronicity:  New Recent urination:  Decreased Context: not post-tussive   Associated symptoms: no abdominal pain, no cough, no diarrhea and no headaches        Past Medical History:  Diagnosis Date  . Abnormal Pap smear of cervix   . Asthma   . Dyspnea   . Heart disease    cardial murmur  . Hyperlipidemia   . Hypertension   . Mitral regurgitation   . Trichomoniasis     Patient Active Problem List   Diagnosis Date Noted  . Sprain of medial collateral ligament of right knee 07/09/2016  . Severe mitral regurgitation 07/02/2016  . Migraine headache 07/02/2016  . Atrial fibrillation, transient ([place]) 07/02/2016  . Acid reflux 01/24/2014  . Hyperlipidemia, mixed 08/24/2013  . Current tobacco use 08/24/2013    Past Surgical History:  Procedure Laterality Date  . MITRAL VALVE REPLACEMENT  11/20/13     OB History    Gravida  2   Para  2   Term      Preterm      AB      Living        SAB      TAB      Ectopic      Multiple      Live Births              Family History  Problem Relation Age of Onset  . Diabetes Mother   . Heart disease Father   . Heart attack Father   . Hypertension Other   . Stroke Other     Social History   Tobacco Use  . Smoking status: Current Every Day Smoker    Packs/day: 0.25    Years: 15.00    Pack years: 3.75    Types: Cigarettes  . Smokeless tobacco: Never Used  Substance Use Topics  . Alcohol use: No  . Drug use: Yes    Types: Marijuana    Comment:  occasional    Home Medications Prior to Admission medications   Medication Sig Start Date End Date Taking? Authorizing Provider  albuterol (PROVENTIL HFA;VENTOLIN HFA) 108 (90 Base) MCG/ACT inhaler Inhale 2 puffs into the lungs every 4 (four) hours as needed for wheezing or shortness of breath.   Yes [provider]  albuterol (PROVENTIL) (2.5 MG/3ML) 0.083% nebulizer solution Take 2.5 mg by nebulization 3 (three) times daily.   Yes [provider]  amLODipine (NORVASC) 10 MG tablet Take 10 mg by mouth daily.  01/19/17  Yes [provider]  atorvastatin (LIPITOR) 20 MG tablet Take 20 mg by mouth daily. 05/01/19  Yes [provider]  carvedilol (COREG) 3.125 MG tablet Take 3.125 mg by mouth 2 (two) times daily. 05/01/19  Yes [provider]  cyclobenzaprine (FLEXERIL) 10 MG tablet Take 10 mg by mouth 3 (three) times daily as needed for muscle spasms.   Yes [provider]  FLUTICASONE PROPIONATE, INHAL, IN Inhale into the lungs daily.   Yes [provider]  gabapentin (NEURONTIN) 300 MG capsule Take 600 mg by mouth 2 (two) times daily.    Yes [provider]  pantoprazole (PROTONIX) 40 MG tablet Take 40 mg by mouth daily.   Yes [provider]  SUMAtriptan (IMITREX) 100 MG tablet Take 100 mg by mouth every 2 (two) hours as needed for migraine. May repeat in 2 hours if headache persists or recurs.   Yes [provider]  warfarin (COUMADIN) 4 MG tablet Take 4 mg by mouth. Takes 2 tabs once a day on Thursday, Friday, Saturday, Sunday.   Yes [provider]  warfarin (COUMADIN) 5 MG tablet Take 5 mg by mouth daily. Take 1 tablet daily on Monday, Tuesday, and Wednesday.   Yes [provider]  cephALEXin (KEFLEX) 500 MG capsule Take 1 capsule (500 mg total) by mouth 4 (four) times daily. 05/13/19   Milton Ferguson, MD  promethazine (PHENERGAN) 25 MG suppository Place 1 suppository (25 mg total) rectally  every 6 (six) hours as needed for nausea or vomiting. 05/13/19   Milton Ferguson, MD    Allergies    Tramadol  Review of Systems   Review of Systems  Constitutional: Negative for appetite change and fatigue.  HENT: Negative for congestion, ear discharge and sinus pressure.   Eyes: Negative for discharge.  Respiratory: Negative for cough.   Cardiovascular: Negative for chest pain.  Gastrointestinal: Positive for vomiting. Negative for abdominal pain and diarrhea.  Genitourinary: Negative for frequency and hematuria.  Musculoskeletal: Negative for back pain.  Skin: Negative for rash.  Neurological: Negative for seizures and headaches.  Psychiatric/Behavioral: Negative for hallucinations.    Physical Exam Updated Vital Signs BP (!) 141/80   Pulse 87   Temp 99.4 F (37.4 C) (Oral) Comment: Simultaneous filing. User may not have seen previous data. Comment (Src): Simultaneous filing. User may not have seen previous data.  Resp 18   Ht 5\' 7"  (1.702 m)   Wt 89.4 kg   LMP 05/07/2019   SpO2 100%   BMI 30.85 kg/m   Physical Exam Vitals and nursing note reviewed.  Constitutional:      Appearance: She is well-developed.  HENT:     Head: Normocephalic.     Mouth/Throat:     Comments: Dry mucous membrane Eyes:     General: No scleral icterus.    Conjunctiva/sclera: Conjunctivae normal.  Neck:     Thyroid: No thyromegaly.  Cardiovascular:     Rate and Rhythm: Normal rate and regular rhythm.     Heart sounds: No murmur. No friction rub. No gallop.   Pulmonary:     Breath sounds: No stridor. No wheezing or rales.  Chest:     Chest wall: No tenderness.  Abdominal:     General: There is no distension.     Tenderness: There is abdominal tenderness. There is no rebound.  Musculoskeletal:        General: Normal range of motion.     Cervical back: Neck supple.  Lymphadenopathy:     Cervical: No cervical adenopathy.  Skin:    Findings: No erythema or rash.  Neurological:      Mental Status: She is alert and oriented to person, place, and time.     Motor: No abnormal muscle tone.     Coordination: Coordination normal.  Psychiatric:        Behavior: Behavior normal.     ED Results / Procedures / Treatments   Labs (all labs ordered are listed, but only abnormal  results are displayed) Labs Reviewed  CBC WITH DIFFERENTIAL/PLATELET - Abnormal; Notable for the following components:      Result Value   WBC 12.4 (*)    Platelets 410 (*)    Lymphs Abs 4.1 (*)    All other components within normal limits  COMPREHENSIVE METABOLIC PANEL - Abnormal; Notable for the following components:   Sodium 134 (*)    Potassium 3.2 (*)    Chloride 97 (*)    Glucose, Bld 113 (*)    All other components within normal limits  URINALYSIS, ROUTINE W REFLEX MICROSCOPIC - Abnormal; Notable for the following components:   Specific Gravity, Urine >1.046 (*)    Hgb urine dipstick LARGE (*)    Protein, ur 30 (*)    RBC / HPF >50 (*)    Bacteria, UA RARE (*)    All other components within normal limits  URINE CULTURE  HCG, QUANTITATIVE, PREGNANCY    EKG None  Radiology CT ABDOMEN PELVIS W CONTRAST  Result Date: 05/13/2019 CLINICAL DATA:  Acute abdominal pain EXAM: CT ABDOMEN AND PELVIS WITH CONTRAST TECHNIQUE: Multidetector CT imaging of the abdomen and pelvis was performed using the standard protocol following bolus administration of intravenous contrast. CONTRAST:  142mL OMNIPAQUE IOHEXOL 300 MG/ML  SOLN COMPARISON:  None. FINDINGS: Lower chest: Lung bases are clear. Hepatobiliary: Liver is within normal limits, noting focal fat/altered perfusion along the falciform ligament. Status post cholecystectomy. No intrahepatic or extrahepatic ductal dilatation. Pancreas: Within normal limits. Spleen: Within normal limits. Adrenals/Urinary Tract: Adrenal glands are within normal limits. 7 mm anterior left upper pole renal cyst (series 2/image 23). Wedge-shaped hypoperfusion involving the  lateral left upper kidney (series 7/image 11), most prominent on the delayed imaging. This appearance is nonspecific but at least raises the possibility of pyelonephritis. Bladder is within normal limits. Stomach/Bowel: Stomach is within normal limits. No evidence of bowel obstruction. Normal appendix (series 2/image 58). Vascular/Lymphatic: No evidence of abdominal aortic aneurysm. No suspicious abdominopelvic lymphadenopathy. Reproductive: Heterogeneous appearance of the uterus, suggesting uterine fibroids. Left ovary is within normal limits. 2.2 cm right ovarian cyst/follicle (series 2/image 68), likely reflecting a benign corpus luteum. Other: No abdominopelvic ascites. Musculoskeletal: Mild degenerative changes at L5-S1. IMPRESSION: Wedge-shaped hypoperfusion involving the lateral left upper kidney, nonspecific but raising the possibility of pyelonephritis. Correlate with laboratory evaluation and clinical exam. No evidence of bowel obstruction.  Normal appendix. Uterine fibroids. Prior cholecystectomy. Electronically Signed   By: Julian Hy M.D.   On: 05/13/2019 11:43    Procedures Procedures (including critical care time)  Medications Ordered in ED Medications  sodium chloride 0.9 % bolus 1,000 mL (0 mLs Intravenous Stopped 05/13/19 1137)  iohexol (OMNIPAQUE) 300 MG/ML solution 100 mL (100 mLs Intravenous Contrast Given 05/13/19 1132)  sodium chloride 0.9 % bolus 1,000 mL (0 mLs Intravenous Stopped 05/13/19 1312)  cefTRIAXone (ROCEPHIN) 1 g in sodium chloride 0.9 % 100 mL IVPB (0 g Intravenous Stopped 05/13/19 1313)    ED Course  I have reviewed the triage vital signs and the nursing notes.  Pertinent labs & imaging results that were available during my care of the patient were reviewed by me and considered in my medical decision making (see chart for details).     Patient has a urinary tract infection and dehydration.  She is placed on Keflex and given Phenergan suppositories and will  follow up with her PCP MDM Rules/Calculators/A&P  final Clinical Impression(s) / ED Diagnoses Final diagnoses:  Acute cystitis with hematuria    Rx / DC Orders ED Discharge Orders         Ordered    cephALEXin (KEFLEX) 500 MG capsule  4 times daily     05/13/19 1329    promethazine (PHENERGAN) 25 MG suppository  Every 6 hours PRN     05/13/19 1329           Milton Ferguson, MD 05/13/19 1333

## 2019-05-14 LAB — URINE CULTURE

## 2020-04-03 ENCOUNTER — Emergency Department (HOSPITAL_COMMUNITY)
Admission: EM | Admit: 2020-04-03 | Discharge: 2020-04-03 | Disposition: A | Discharge status: Home / Self Care | Payer: Medicaid Other | Attending: Emergency Medicine | Admitting: Emergency Medicine

## 2020-04-03 ENCOUNTER — Emergency Department (HOSPITAL_COMMUNITY): Payer: Medicaid Other

## 2020-04-03 ENCOUNTER — Other Ambulatory Visit: Payer: Self-pay

## 2020-04-03 ENCOUNTER — Encounter (HOSPITAL_COMMUNITY): Payer: Self-pay

## 2020-04-03 DIAGNOSIS — R1010 Upper abdominal pain, unspecified: Secondary | ICD-10-CM | POA: Insufficient documentation

## 2020-04-03 DIAGNOSIS — I10 Essential (primary) hypertension: Secondary | ICD-10-CM | POA: Diagnosis not present

## 2020-04-03 DIAGNOSIS — R748 Abnormal levels of other serum enzymes: Secondary | ICD-10-CM | POA: Diagnosis not present

## 2020-04-03 DIAGNOSIS — M47817 Spondylosis without myelopathy or radiculopathy, lumbosacral region: Secondary | ICD-10-CM | POA: Diagnosis not present

## 2020-04-03 DIAGNOSIS — R7401 Elevation of levels of liver transaminase levels: Secondary | ICD-10-CM | POA: Diagnosis not present

## 2020-04-03 DIAGNOSIS — I700 Atherosclerosis of aorta: Secondary | ICD-10-CM | POA: Insufficient documentation

## 2020-04-03 DIAGNOSIS — J45909 Unspecified asthma, uncomplicated: Secondary | ICD-10-CM | POA: Diagnosis not present

## 2020-04-03 DIAGNOSIS — Z9049 Acquired absence of other specified parts of digestive tract: Secondary | ICD-10-CM | POA: Insufficient documentation

## 2020-04-03 DIAGNOSIS — D259 Leiomyoma of uterus, unspecified: Secondary | ICD-10-CM | POA: Diagnosis not present

## 2020-04-03 DIAGNOSIS — F17210 Nicotine dependence, cigarettes, uncomplicated: Secondary | ICD-10-CM | POA: Diagnosis not present

## 2020-04-03 DIAGNOSIS — Z79899 Other long term (current) drug therapy: Secondary | ICD-10-CM | POA: Diagnosis not present

## 2020-04-03 DIAGNOSIS — Z7901 Long term (current) use of anticoagulants: Secondary | ICD-10-CM | POA: Diagnosis not present

## 2020-04-03 LAB — URINALYSIS, ROUTINE W REFLEX MICROSCOPIC
Bilirubin Urine: NEGATIVE
Glucose, UA: NEGATIVE mg/dL
Hgb urine dipstick: NEGATIVE
Ketones, ur: NEGATIVE mg/dL
Leukocytes,Ua: NEGATIVE
Nitrite: NEGATIVE
Protein, ur: NEGATIVE mg/dL
Specific Gravity, Urine: >1.046 — ABNORMAL HIGH (ref 1.005–1.030)
pH: 5 (ref 5.0–8.0)

## 2020-04-03 LAB — CBC WITH DIFFERENTIAL/PLATELET
Abs Immature Granulocytes: 0.01 10*3/uL (ref 0.00–0.07)
Basophils Absolute: 0 10*3/uL (ref 0.0–0.1)
Basophils Relative: 0 %
Eosinophils Absolute: 0.1 10*3/uL (ref 0.0–0.5)
Eosinophils Relative: 1 %
HCT: 43.7 % (ref 36.0–46.0)
Hemoglobin: 14.5 g/dL (ref 12.0–15.0)
Immature Granulocytes: 0 %
Lymphocytes Relative: 38 %
Lymphs Abs: 2.3 10*3/uL (ref 0.7–4.0)
MCH: 31.9 pg (ref 26.0–34.0)
MCHC: 33.2 g/dL (ref 30.0–36.0)
MCV: 96.3 fL (ref 80.0–100.0)
Monocytes Absolute: 0.5 10*3/uL (ref 0.1–1.0)
Monocytes Relative: 8 %
Neutro Abs: 3.3 10*3/uL (ref 1.7–7.7)
Neutrophils Relative %: 53 %
Platelets: 338 10*3/uL (ref 150–400)
RBC: 4.54 MIL/uL (ref 3.87–5.11)
RDW: 14 % (ref 11.5–15.5)
WBC: 6.1 10*3/uL (ref 4.0–10.5)
nRBC: 0 % (ref 0.0–0.2)

## 2020-04-03 LAB — COMPREHENSIVE METABOLIC PANEL
ALT: 380 U/L — ABNORMAL HIGH (ref 0–44)
AST: 171 U/L — ABNORMAL HIGH (ref 15–41)
Albumin: 4.4 g/dL (ref 3.5–5.0)
Alkaline Phosphatase: 459 U/L — ABNORMAL HIGH (ref 38–126)
Anion gap: 7 (ref 5–15)
BUN: 15 mg/dL (ref 6–20)
CO2: 28 mmol/L (ref 22–32)
Calcium: 10.1 mg/dL (ref 8.9–10.3)
Chloride: 101 mmol/L (ref 98–111)
Creatinine, Ser: 0.85 mg/dL (ref 0.44–1.00)
GFR, Estimated: >60 mL/min (ref >60)
Glucose, Bld: 127 mg/dL — ABNORMAL HIGH (ref 70–99)
Potassium: 4.6 mmol/L (ref 3.5–5.1)
Sodium: 136 mmol/L (ref 135–145)
Total Bilirubin: 0.9 mg/dL (ref 0.3–1.2)
Total Protein: 8.6 g/dL — ABNORMAL HIGH (ref 6.5–8.1)

## 2020-04-03 LAB — PROTIME-INR
INR: 1.4 — ABNORMAL HIGH (ref 0.8–1.2)
Prothrombin Time: 17 seconds — ABNORMAL HIGH (ref 11.4–15.2)

## 2020-04-03 LAB — HCG, QUANTITATIVE, PREGNANCY: hCG, Beta Chain, Quant, S: 1 m[IU]/mL (ref <5)

## 2020-04-03 LAB — LIPASE, BLOOD: Lipase: 166 U/L — ABNORMAL HIGH (ref 11–51)

## 2020-04-03 LAB — ACETAMINOPHEN LEVEL: Acetaminophen (Tylenol), Serum: <10 ug/mL — ABNORMAL LOW (ref 10–30)

## 2020-04-03 IMAGING — CT CT ABD-PELV W/ CM
2 of 5 series · 16 of 46 positions shown, 18 images · IV contrast (Omnipaque or Isovue)
Comparison: [DATE]

CLINICAL DATA: Abdominal pain and fever. Constipation. Elevated
liver enzymes.

EXAM:
CT ABDOMEN AND PELVIS WITH CONTRAST
TECHNIQUE: Multidetector CT imaging of the abdomen and pelvis was performed
using the standard protocol following bolus administration of
intravenous contrast.
CONTRAST:  100mL OMNIPAQUE IOHEXOL 300 MG/ML  SOLN

[Series 2: axial st · axial · 0.83mm/px · z∈[+745,+1160]mm · 13 of 93 slices shown, 15 images]
[im 5/93  soft-tissue]
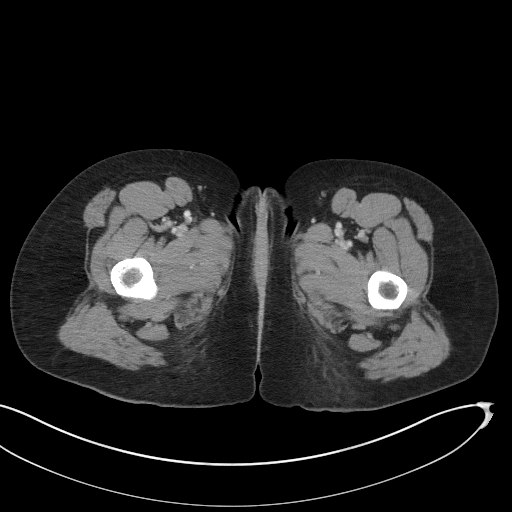
[im 5/93  bone]
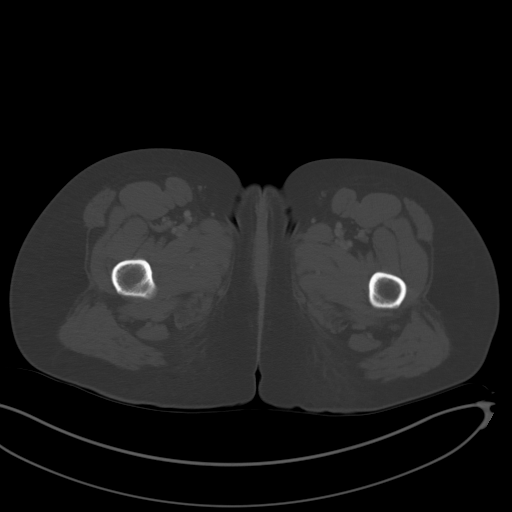
[im 14/93  soft-tissue]
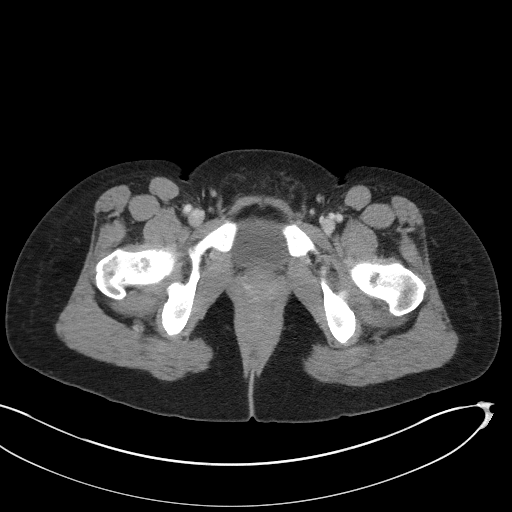
[im 19/93  soft-tissue]
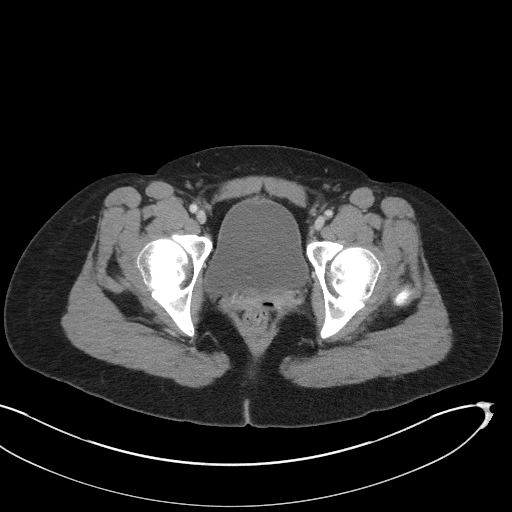
[im 28/93  soft-tissue]
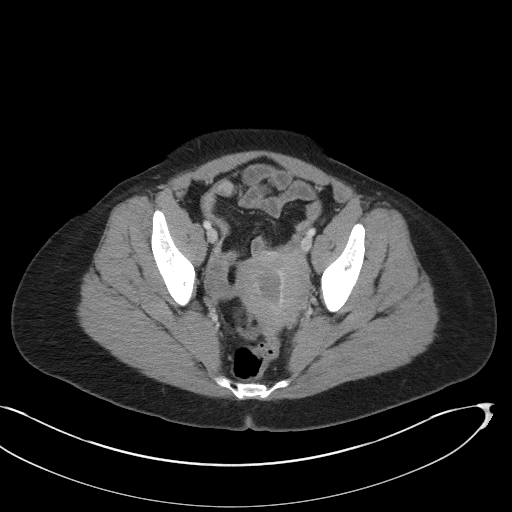
[im 33/93  soft-tissue]
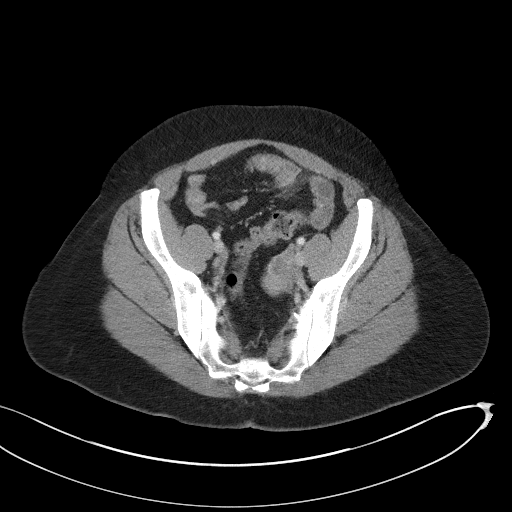
[im 42/93  soft-tissue]
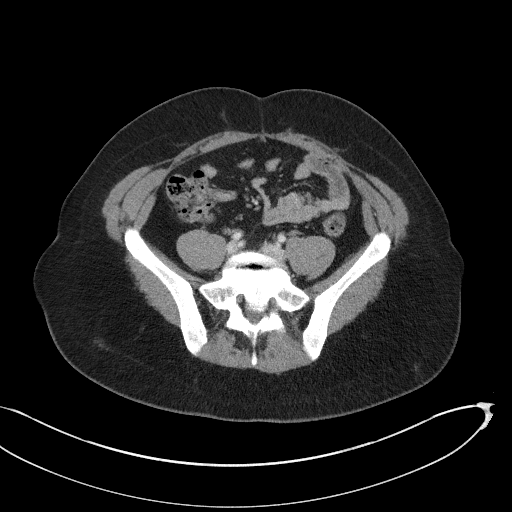
[im 47/93  soft-tissue]
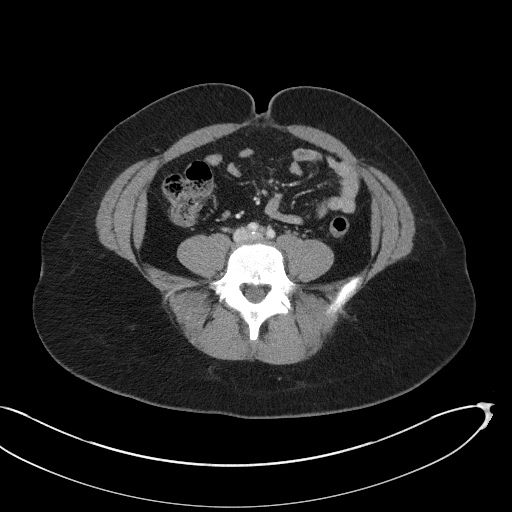
[im 51/93  soft-tissue]
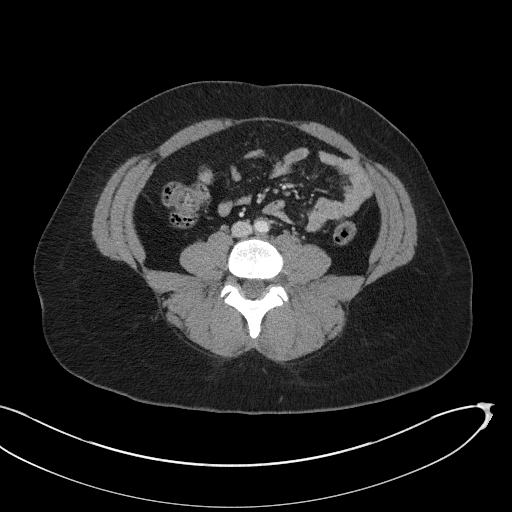
[im 60/93  soft-tissue]
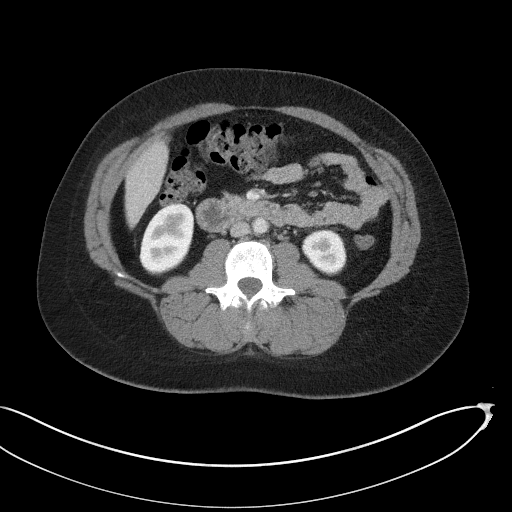
[im 60/93  bone]
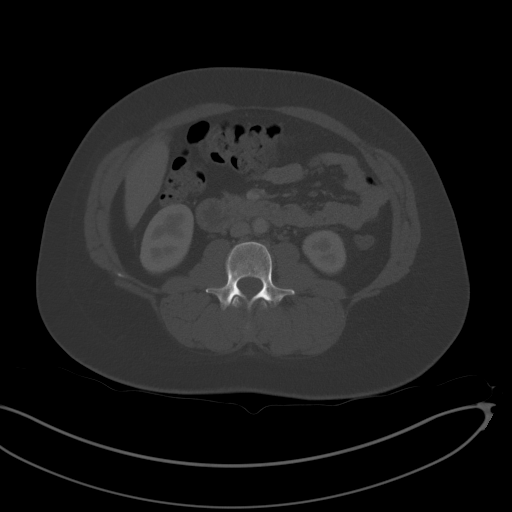
[im 65/93  soft-tissue]
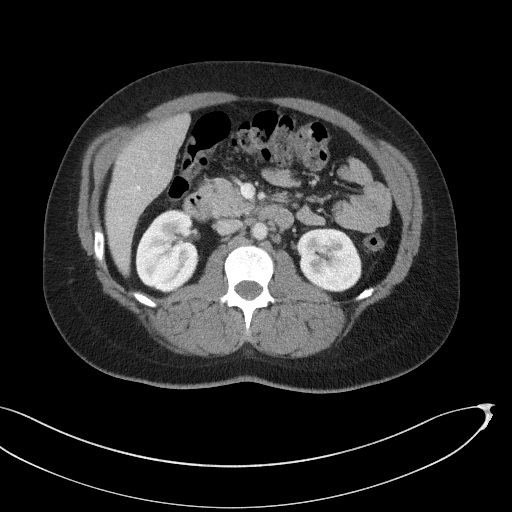
[im 74/93  soft-tissue]
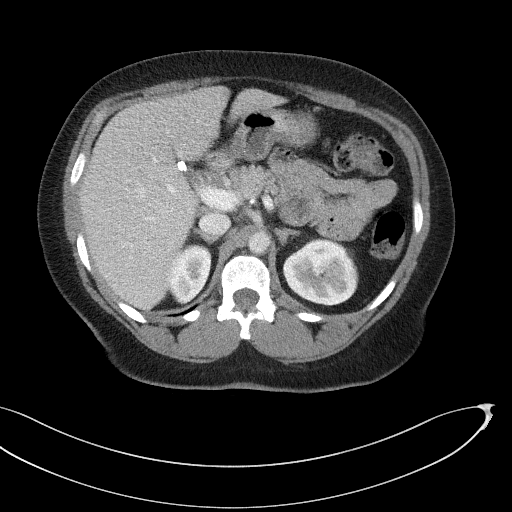
[im 79/93  soft-tissue]
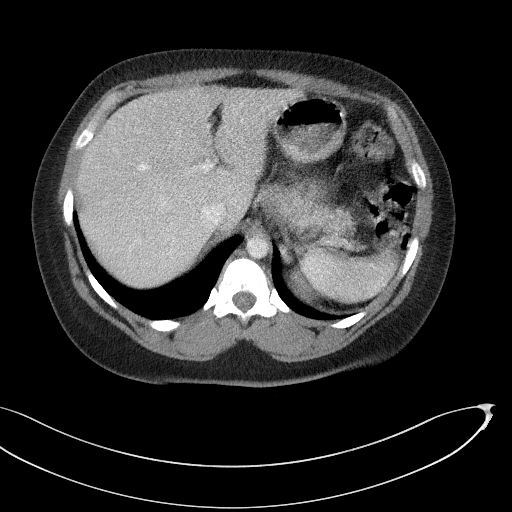
[im 88/93  soft-tissue]
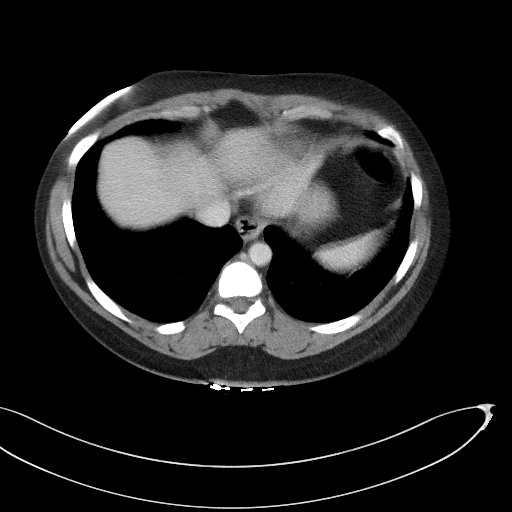

[Series 5: coronal st · coronal · 0.79mm/px · 3 of 113 slices shown]
[im 38/113  soft-tissue]
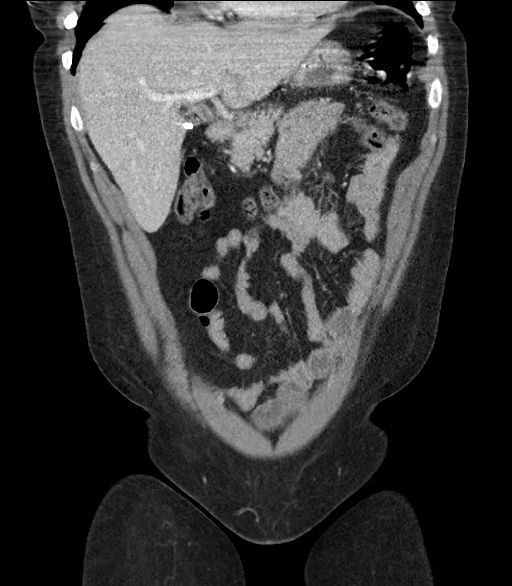
[im 50/113  soft-tissue]
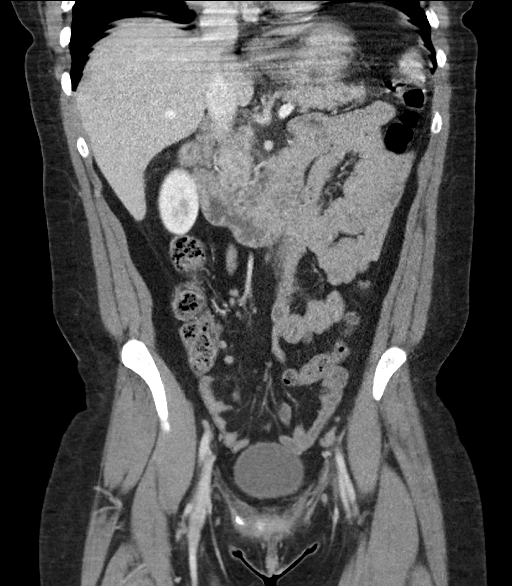
[im 63/113  soft-tissue]
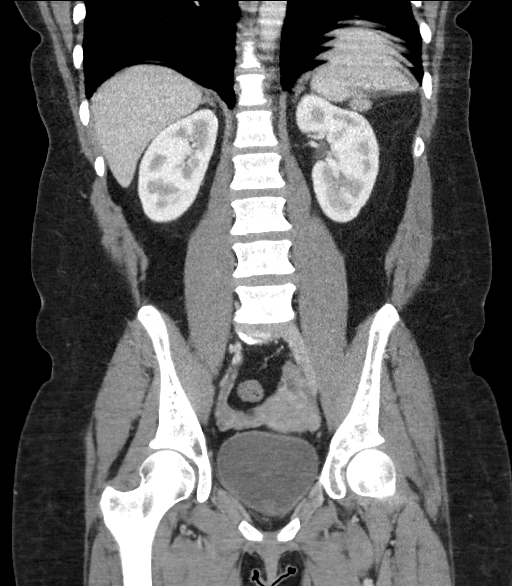

[16 of 46 positions shown; findings below may reference images not displayed]

FINDINGS: Lower chest: Normal

Hepatobiliary: Previous cholecystectomy. Liver parenchyma is normal.

Pancreas: Normal

Spleen: Normal

Adrenals/Urinary Tract: Adrenal glands are normal. Right kidney is
normal. Chronic focal volume loss in the upper pole of the left
kidney. No hydronephrosis. No stone disease. Bladder is normal.

Stomach/Bowel: Stomach and small intestine are normal. The appendix
is normal. Amount of stool in the colon is within normal limits. No
diverticulosis or diverticulitis.

Vascular/Lymphatic: Aortic atherosclerosis. No aneurysm. IVC is
normal. No retroperitoneal adenopathy.

Reproductive: Multiple small leiomyomas of the uterus. No adnexal
lesion.

Other: No free fluid or air.  No hernia.

Musculoskeletal: Chronic degenerative change at L5-S1 which could be
a cause of low back pain.
IMPRESSION: 1. No acute finding to explain the clinical presentation. No
evidence of bowel obstruction or inflammation. Amount of stool in
the colon is within normal limits.
2. Previous cholecystectomy.
3. Chronic focal volume loss in the upper pole of the left kidney.
4. Aortic atherosclerosis.
5. Multiple small leiomyomas of the uterus.
6. Chronic degenerative change at L5-S1 which could be a cause of
low back pain.

Aortic Atherosclerosis ([6F]-[6F]).

## 2020-04-03 MED ORDER — ONDANSETRON HCL 4 MG/2ML IJ SOLN
4.0000 mg | Freq: Once | INTRAMUSCULAR | Status: AC
  Administered 2020-04-03: 4 mg via INTRAVENOUS
  Filled 2020-04-03: qty 2

## 2020-04-03 MED ORDER — ONDANSETRON 4 MG PO TBDP: 4.0000 mg | ORAL_TABLET | Freq: Three times a day (TID) | ORAL | 0 refills | Status: DC | PRN

## 2020-04-03 MED ORDER — IOHEXOL 300 MG/ML  SOLN
100.0000 mL | Freq: Once | INTRAMUSCULAR | Status: AC | PRN
  Administered 2020-04-03: 100 mL via INTRAVENOUS

## 2020-04-03 MED ORDER — SODIUM CHLORIDE 0.9 % IV BOLUS
1000.0000 mL | Freq: Once | INTRAVENOUS | Status: AC
  Administered 2020-04-03: 1000 mL via INTRAVENOUS

## 2020-04-03 NOTE — ED Triage Notes (Addendum)
Pt reports that she has had abdominal pain for 3 weeks. She has been seen at the health dept and worked up. Urine was clear and covid was negative. Pt reports worse with eating and difficulty keeping food down.  Pt brought in results from labs which show elevated liver enzymes. Pt reports health dept thought she had a kidney infection and she completed a course of antibiotics

## 2020-04-03 NOTE — Discharge Instructions (Signed)
As discussed, you are leaving against our advice as we suspect you have a blockage (probably a bile stone) in the tube that drains your liver and your pancrease which would explain your abnormal labs.  It is possible to get very ill from this problem very quickly and important that you return here (or ideally to [facility] in [place] since we do not currently have the ability to do the MRI test that you need) once you have arranged care for your children.  Return here immediately for worsened pain and fever.  You have been prescribed zofran for nausea relief.

## 2020-04-03 NOTE — ED Provider Notes (Signed)
[facility] EMERGENCY DEPARTMENT Provider Note   CSN: [id number] Arrival date & time: 04/03/20  1144     History Chief Complaint  Patient presents with  . abnormal labs    Anne Kirby is a 39 y.o. 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  She also endorses problems with chronic anemia.  She is currently on Coumadin for anticoagulation.  Surgical history significant for cholecystectomy.  Presenting for evaluation of a 3-week history of upper abdominal and low back pain along with nausea without emesis, intermittent fevers with T-max of 102, last episode occurring 2 days ago along with upper abdominal pain and generalized abdominal bloating.  She was seen by her PCP at [facility] and labs there showed elevated liver enzymes, lipase and alkaline phosphatase.    She was initially treated for a UTI as she had a very dark urine but it was unclear whether there was actual infection.  Describes her urine as [employer] and coloration.  Last BM 2 days ago did not notice any changes in color.  She has had no other treatments for her symptoms other than Bactrim which she has completed.  She denies excessive Tylenol use, no IV drug use.  She does describe increasing her EtOH intake over the holiday, states that she drank 1-1/2 bottles of wine between Christmas and New Year's, but was not binge drinking, stating she had 1 glass of wine per day on average.  HPI     Past Medical History:  Diagnosis Date  . Abnormal Pap smear of cervix   . Asthma   . Dyspnea   . Heart disease    cardial murmur  . Hyperlipidemia   . Hypertension   . Mitral regurgitation   . Trichomoniasis     Patient Active Problem List   Diagnosis Date Noted  . Sprain of medial collateral ligament of right knee 07/09/2016  . Severe mitral regurgitation  07/02/2016  . Migraine headache 07/02/2016  . Atrial fibrillation, transient ([place]) 07/02/2016  . Acid reflux 01/24/2014  . Hyperlipidemia, mixed 08/24/2013  . Current tobacco use 08/24/2013    Past Surgical History:  Procedure Laterality Date  . MITRAL VALVE REPLACEMENT  11/20/13     OB History    Gravida  2   Para  2   Term      Preterm      AB      Living        SAB      IAB      Ectopic      Multiple      Live Births              Family History  Problem Relation Age of Onset  . Diabetes Mother   . Heart disease Father   . Heart attack Father   . Hypertension Other   . Stroke Other     Social History   Tobacco Use  . Smoking status: Current Every Day Smoker    Packs/day: 0.25    Years: 15.00    Pack years: 3.75    Types: Cigarettes  . Smokeless tobacco: Never Used  Substance Use Topics  . Alcohol use: No  . Drug use: Yes    Types: Marijuana    Comment: occasional    Home Medications Prior to Admission medications   Medication Sig Start Date End  Date Taking? Authorizing Provider  albuterol (PROVENTIL HFA;VENTOLIN HFA) 108 (90 Base) MCG/ACT inhaler Inhale 2 puffs into the lungs every 4 (four) hours as needed for wheezing or shortness of breath.   Yes [provider]  albuterol (PROVENTIL) (2.5 MG/3ML) 0.083% nebulizer solution Take 2.5 mg by nebulization 3 (three) times daily.   Yes [provider]  amLODipine (NORVASC) 10 MG tablet Take 10 mg by mouth daily.  01/19/17  Yes [provider]  aspirin 81 MG chewable tablet Chew 81 mg by mouth daily.   Yes [provider]  atorvastatin (LIPITOR) 20 MG tablet Take 20 mg by mouth daily. 05/01/19  Yes [provider]  carvedilol (COREG) 3.125 MG tablet Take 3.125 mg by mouth 2 (two) times daily. 05/01/19  Yes [provider]  cyclobenzaprine (FLEXERIL) 10 MG tablet Take 10 mg by mouth 3 (three) times daily as needed for muscle spasms.   Yes [provider]  diazepam (VALIUM) 5 MG tablet Take 5 mg by mouth every 12 (twelve) hours as needed. 11/02/19  Yes [provider]  ferrous sulfate 324 (65 Fe) MG TBEC Take 1 tablet by mouth daily.   Yes [provider]  FLUTICASONE PROPIONATE, INHAL, IN Inhale into the lungs daily.   Yes [provider]  gabapentin (NEURONTIN) 300 MG capsule Take 600 mg by mouth 2 (two) times daily.    Yes [provider]  ondansetron (ZOFRAN ODT) 4 MG disintegrating tablet Take 1 tablet (4 mg total) by mouth every 8 (eight) hours as needed for nausea or vomiting. 04/03/20  Yes Kacie Huxtable, Almyra Free, PA-C  pantoprazole (PROTONIX) 40 MG tablet Take 40 mg by mouth daily.   Yes [provider]  SUMAtriptan (IMITREX) 100 MG tablet Take 100 mg by mouth every 2 (two) hours as needed for migraine. May repeat in 2 hours if headache persists or recurs.   Yes [provider]  warfarin (COUMADIN) 4 MG tablet Take 4 mg by mouth. Takes 2 tabs once a day on Thursday, Friday, Saturday, Sunday.   Yes [provider]  warfarin (COUMADIN) 5 MG tablet Take 5 mg by mouth daily. Take 1 tablet daily on Monday, Tuesday, and Wednesday.   Yes [provider]  cephALEXin (KEFLEX) 500 MG capsule Take 1 capsule (500 mg total) by mouth 4 (four) times daily. Patient not taking: No sig reported 05/13/19   Milton Ferguson, MD  promethazine (PHENERGAN) 25 MG suppository Place 1 suppository (25 mg total) rectally every 6 (six) hours as needed for nausea or vomiting. Patient not taking: No sig reported 05/13/19   Milton Ferguson, MD  sulfamethoxazole-trimethoprim (BACTRIM DS) 800-160 MG tablet Take 1 tablet by mouth 2 (two) times daily. Patient not taking: No sig reported 03/26/20   [provider]    Allergies    Tramadol  Review of Systems   Review of Systems  Constitutional: Positive for fatigue and fever.  HENT: Negative for congestion and sore throat.   Eyes: Negative.    Respiratory: Negative for chest tightness and shortness of breath.   Cardiovascular: Negative for chest pain.  Gastrointestinal: Positive for abdominal pain and nausea. Negative for constipation, diarrhea and vomiting.  Genitourinary: Negative.  Negative for dysuria.  Musculoskeletal: Positive for back pain. Negative for arthralgias, joint swelling and neck pain.  Skin: Negative.  Negative for rash and wound.  Neurological: Negative for dizziness, weakness, light-headedness, numbness and headaches.  Psychiatric/Behavioral: Negative.     Physical Exam Updated Vital Signs BP  131/85   Pulse 85   Temp 98.9 F (37.2 C) (Oral)   Resp 18   Ht '5\' 7"'  (1.702 m)   Wt 83.9 kg   LMP 03/26/2020   SpO2 99%   BMI 28.98 kg/m   Physical Exam Vitals and nursing note reviewed.  Constitutional:      Appearance: She is well-developed and well-nourished.  HENT:     Head: Normocephalic and atraumatic.  Eyes:     General: Scleral icterus present.     Conjunctiva/sclera: Conjunctivae normal.  Cardiovascular:     Rate and Rhythm: Normal rate and regular rhythm.     Pulses: Intact distal pulses.     Heart sounds: Normal heart sounds.  Pulmonary:     Effort: Pulmonary effort is normal.     Breath sounds: Normal breath sounds. No wheezing.  Abdominal:     General: Bowel sounds are normal.     Palpations: Abdomen is soft.     Tenderness: There is abdominal tenderness in the epigastric area. There is no guarding or rebound. Negative signs include Murphy's sign.     Comments: Abdomen is soft, no guarding.   Musculoskeletal:        General: Normal range of motion.     Cervical back: Normal range of motion.  Skin:    General: Skin is warm and dry.     Capillary Refill: Capillary refill takes less than 2 seconds.  Neurological:     General: No focal deficit present.     Mental Status: She is alert.  Psychiatric:        Mood and Affect: Mood and affect normal.     ED Results / Procedures /  Treatments   Labs (all labs ordered are listed, but only abnormal results are displayed) Labs Reviewed  COMPREHENSIVE METABOLIC PANEL - Abnormal; Notable for the following components:      Result Value   Glucose, Bld 127 (*)    Total Protein 8.6 (*)    AST 171 (*)    ALT 380 (*)    Alkaline Phosphatase 459 (*)    All other components within normal limits  URINALYSIS, ROUTINE W REFLEX MICROSCOPIC - Abnormal; Notable for the following components:   Specific Gravity, Urine >1.046 (*)    All other components within normal limits  LIPASE, BLOOD - Abnormal; Notable for the following components:   Lipase 166 (*)    All other components within normal limits  PROTIME-INR - Abnormal; Notable for the following components:   Prothrombin Time 17.0 (*)    INR 1.4 (*)    All other components within normal limits  ACETAMINOPHEN LEVEL - Abnormal; Notable for the following components:   Acetaminophen (Tylenol), Serum <10 (*)    All other components within normal limits  CBC WITH DIFFERENTIAL/PLATELET  HCG, QUANTITATIVE, PREGNANCY  HEPATITIS PANEL, ACUTE    EKG None  Radiology CT ABDOMEN PELVIS W CONTRAST  Result Date: 04/03/2020 CLINICAL DATA:  Abdominal pain and fever. Constipation. Elevated liver enzymes. EXAM: CT ABDOMEN AND PELVIS WITH CONTRAST TECHNIQUE: Multidetector CT imaging of the abdomen and pelvis was performed using the standard protocol following bolus administration of intravenous contrast. CONTRAST:  142m OMNIPAQUE IOHEXOL 300 MG/ML  SOLN COMPARISON:  05/13/2019 FINDINGS: Lower chest: Normal Hepatobiliary: Previous cholecystectomy. Liver parenchyma is normal. Pancreas: Normal Spleen: Normal Adrenals/Urinary Tract: Adrenal glands are normal. Right kidney is normal. Chronic focal volume loss in the upper pole of the left kidney. No hydronephrosis. No stone disease. Bladder is  normal. Stomach/Bowel: Stomach and small intestine are normal. The appendix is normal. Amount of stool in the  colon is within normal limits. No diverticulosis or diverticulitis. Vascular/Lymphatic: Aortic atherosclerosis. No aneurysm. IVC is normal. No retroperitoneal adenopathy. Reproductive: Multiple small leiomyomas of the uterus. No adnexal lesion. Other: No free fluid or air.  No hernia. Musculoskeletal: Chronic degenerative change at L5-S1 which could be a cause of low back pain. IMPRESSION: 1. No acute finding to explain the clinical presentation. No evidence of bowel obstruction or inflammation. Amount of stool in the colon is within normal limits. 2. Previous cholecystectomy. 3. Chronic focal volume loss in the upper pole of the left kidney. 4. Aortic atherosclerosis. 5. Multiple small leiomyomas of the uterus. 6. Chronic degenerative change at L5-S1 which could be a cause of low back pain. Aortic Atherosclerosis (ICD10-I70.0). Electronically Signed   By: Nelson Chimes M.D.   On: 04/03/2020 16:43    Procedures Procedures (including critical care time)  Medications Ordered in ED Medications  sodium chloride 0.9 % bolus 1,000 mL (0 mLs Intravenous Stopped 04/03/20 1624)  iohexol (OMNIPAQUE) 300 MG/ML solution 100 mL (100 mLs Intravenous Contrast Given 04/03/20 1637)  ondansetron (ZOFRAN) injection 4 mg (4 mg Intravenous Given 04/03/20 1822)    ED Course  I have reviewed the triage vital signs and the nursing notes.  Pertinent labs & imaging results that were available during my care of the patient were reviewed by me and considered in my medical decision making (see chart for details).    MDM Rules/Calculators/A&P                          Patient with approximate 3-week history of intermittent fevers and upper abdominal pain. Significantly abnormal liver enzymes including her alk phosphatase and lipase as well. CT imaging is negative for biliary dilatation, liver and pancreas without significant inflammatory changes.  Discussed patient's history and labs and CT findings with Dr. Gala Romney of  gastroenterology. He recommends that this patient have an MRCP, over suspicion that she could have a stone or other blockage in her common bile duct causing her symptoms. If this is found to be the case she will require ERCP, Dr. Laural Golden is not available for the next 4 days therefore patient will be better served transferring to [facility] for her care.  Also we do not have MRCP capability here as there is a scheduled repair for the MRI which will not occur until at least tomorrow afternoon per conversation with radiology.  Had a conversation with patient regarding these concerns and our recommendation that she be admitted for further evaluation but that we want to transfer her down to [facility] where she can get the studies needed and the specialist is available who can take care of her symptoms. Patient was adamant about not being admitted to the hospital at this time. She has a special needs adolescent at home that she will need to place with his father and she is not prepared to stay today. She was strongly encouraged to get reevaluated immediately, preferably within the next 24 hours, recommended she may consider going directly to [facility], however this is not convenient she can return here, but she should be prepared to be transferred to [facility]. Strongly explained to her that if she has a stone and with these intermittent fevers if she has a brewing infection in her biliary tree this could make her very sick very quickly. Patient understands and will  plan to return tomorrow for definitive treatment. Final Clinical Impression(s) / ED Diagnoses Final diagnoses:  Pain of upper abdomen  Abnormal transaminases  Elevated lipase    Rx / DC Orders ED Discharge Orders         Ordered    ondansetron (ZOFRAN ODT) 4 MG disintegrating tablet  Every 8 hours PRN        04/03/20 1921           Landis Martins 04/03/20 2301    Milton Ferguson, MD 04/04/20 [phone]

## 2020-04-04 ENCOUNTER — Emergency Department: Payer: Medicaid Other

## 2020-04-04 ENCOUNTER — Encounter: Payer: Self-pay | Admitting: Emergency Medicine

## 2020-04-04 ENCOUNTER — Observation Stay
Admission: EM | Admit: 2020-04-04 | Discharge: 2020-04-05 | Disposition: A | Discharge status: Home / Self Care | Payer: Medicaid Other | Attending: Internal Medicine | Admitting: Internal Medicine

## 2020-04-04 DIAGNOSIS — Z20822 Contact with and (suspected) exposure to covid-19: Secondary | ICD-10-CM | POA: Insufficient documentation

## 2020-04-04 DIAGNOSIS — Z7901 Long term (current) use of anticoagulants: Secondary | ICD-10-CM | POA: Insufficient documentation

## 2020-04-04 DIAGNOSIS — I340 Nonrheumatic mitral (valve) insufficiency: Secondary | ICD-10-CM | POA: Diagnosis not present

## 2020-04-04 DIAGNOSIS — R1011 Right upper quadrant pain: Principal | ICD-10-CM

## 2020-04-04 DIAGNOSIS — E785 Hyperlipidemia, unspecified: Secondary | ICD-10-CM | POA: Insufficient documentation

## 2020-04-04 DIAGNOSIS — K859 Acute pancreatitis without necrosis or infection, unspecified: Secondary | ICD-10-CM | POA: Diagnosis not present

## 2020-04-04 DIAGNOSIS — Z87440 Personal history of urinary (tract) infections: Secondary | ICD-10-CM | POA: Insufficient documentation

## 2020-04-04 DIAGNOSIS — I4891 Unspecified atrial fibrillation: Secondary | ICD-10-CM | POA: Diagnosis present

## 2020-04-04 DIAGNOSIS — K851 Biliary acute pancreatitis without necrosis or infection: Secondary | ICD-10-CM

## 2020-04-04 DIAGNOSIS — Z8249 Family history of ischemic heart disease and other diseases of the circulatory system: Secondary | ICD-10-CM | POA: Diagnosis not present

## 2020-04-04 DIAGNOSIS — Z720 Tobacco use: Secondary | ICD-10-CM | POA: Diagnosis present

## 2020-04-04 DIAGNOSIS — E782 Mixed hyperlipidemia: Secondary | ICD-10-CM | POA: Diagnosis present

## 2020-04-04 DIAGNOSIS — R7401 Elevation of levels of liver transaminase levels: Secondary | ICD-10-CM | POA: Diagnosis not present

## 2020-04-04 DIAGNOSIS — Z79899 Other long term (current) drug therapy: Secondary | ICD-10-CM | POA: Insufficient documentation

## 2020-04-04 DIAGNOSIS — F17210 Nicotine dependence, cigarettes, uncomplicated: Secondary | ICD-10-CM | POA: Insufficient documentation

## 2020-04-04 DIAGNOSIS — I10 Essential (primary) hypertension: Secondary | ICD-10-CM | POA: Diagnosis present

## 2020-04-04 DIAGNOSIS — J45909 Unspecified asthma, uncomplicated: Secondary | ICD-10-CM | POA: Insufficient documentation

## 2020-04-04 DIAGNOSIS — Z7982 Long term (current) use of aspirin: Secondary | ICD-10-CM | POA: Insufficient documentation

## 2020-04-04 DIAGNOSIS — Z952 Presence of prosthetic heart valve: Secondary | ICD-10-CM | POA: Insufficient documentation

## 2020-04-04 DIAGNOSIS — R109 Unspecified abdominal pain: Secondary | ICD-10-CM | POA: Diagnosis present

## 2020-04-04 DIAGNOSIS — Z9049 Acquired absence of other specified parts of digestive tract: Secondary | ICD-10-CM | POA: Insufficient documentation

## 2020-04-04 DIAGNOSIS — R945 Abnormal results of liver function studies: Secondary | ICD-10-CM | POA: Diagnosis not present

## 2020-04-04 DIAGNOSIS — R748 Abnormal levels of other serum enzymes: Secondary | ICD-10-CM

## 2020-04-04 DIAGNOSIS — T50905A Adverse effect of unspecified drugs, medicaments and biological substances, initial encounter: Secondary | ICD-10-CM

## 2020-04-04 LAB — COMPREHENSIVE METABOLIC PANEL
ALT: 297 U/L — ABNORMAL HIGH (ref 0–44)
AST: 136 U/L — ABNORMAL HIGH (ref 15–41)
Albumin: 4.1 g/dL (ref 3.5–5.0)
Alkaline Phosphatase: 386 U/L — ABNORMAL HIGH (ref 38–126)
Anion gap: 7 (ref 5–15)
BUN: 12 mg/dL (ref 6–20)
CO2: 26 mmol/L (ref 22–32)
Calcium: 9.5 mg/dL (ref 8.9–10.3)
Chloride: 104 mmol/L (ref 98–111)
Creatinine, Ser: 0.71 mg/dL (ref 0.44–1.00)
GFR, Estimated: >60 mL/min (ref >60)
Glucose, Bld: 155 mg/dL — ABNORMAL HIGH (ref 70–99)
Potassium: 4.1 mmol/L (ref 3.5–5.1)
Sodium: 137 mmol/L (ref 135–145)
Total Bilirubin: 0.8 mg/dL (ref 0.3–1.2)
Total Protein: 7.7 g/dL (ref 6.5–8.1)

## 2020-04-04 LAB — CBC
HCT: 39.5 % (ref 36.0–46.0)
Hemoglobin: 13.1 g/dL (ref 12.0–15.0)
MCH: 31.7 pg (ref 26.0–34.0)
MCHC: 33.2 g/dL (ref 30.0–36.0)
MCV: 95.6 fL (ref 80.0–100.0)
Platelets: 326 10*3/uL (ref 150–400)
RBC: 4.13 MIL/uL (ref 3.87–5.11)
RDW: 13.9 % (ref 11.5–15.5)
WBC: 6.1 10*3/uL (ref 4.0–10.5)
nRBC: 0 % (ref 0.0–0.2)

## 2020-04-04 LAB — LIPASE, BLOOD: Lipase: 137 U/L — ABNORMAL HIGH (ref 11–51)

## 2020-04-04 LAB — TRIGLYCERIDES: Triglycerides: 191 mg/dL — ABNORMAL HIGH (ref <150)

## 2020-04-04 LAB — HEPATITIS PANEL, ACUTE
HCV Ab: NONREACTIVE
Hep A IgM: NONREACTIVE
Hep B C IgM: NONREACTIVE
Hepatitis B Surface Ag: NONREACTIVE

## 2020-04-04 LAB — HEPATITIS A ANTIBODY, IGM: Hep A IgM: NONREACTIVE

## 2020-04-04 LAB — HEPATITIS B CORE ANTIBODY, IGM: Hep B C IgM: NONREACTIVE

## 2020-04-04 LAB — HEPATITIS C ANTIBODY: HCV Ab: NONREACTIVE

## 2020-04-04 LAB — HEPATITIS B SURFACE ANTIGEN: Hepatitis B Surface Ag: NONREACTIVE

## 2020-04-04 LAB — HIV ANTIBODY (ROUTINE TESTING W REFLEX): HIV Screen 4th Generation wRfx: NONREACTIVE

## 2020-04-04 IMAGING — MR MR ABDOMEN WO/W CM MRCP
18 of 19 series · 45 of 48 positions shown · IV contrast (7.5ml Gadavist)
Comparison: CT evaluation [DATE]

CLINICAL DATA: Abdominal pain, suspected biliary obstruction in
this 39-year-old female.

EXAM:
MRI ABDOMEN WITHOUT AND WITH CONTRAST (INCLUDING MRCP)
TECHNIQUE: Multiplanar multisequence MR imaging of the abdomen was performed
both before and after the administration of intravenous contrast.
Heavily T2-weighted images of the biliary and pancreatic ducts were
obtained, and three-dimensional MRCP images were rendered by post
processing.
CONTRAST:  7.5mL GADAVIST GADOBUTROL 1 MMOL/ML IV SOLN

[Series 3: T2 · coronal · 6.0mm · 1.19mm/px · 2 of 30 slices shown (1 of 2)]
[im 1/30]
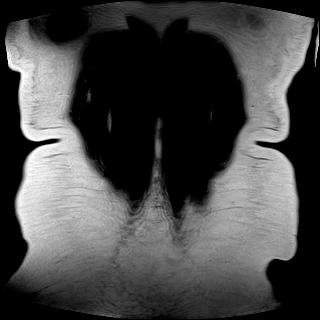
[im 30/30]
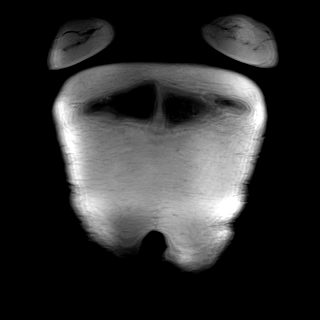

[Series 4: T2 · axial · 6.0mm · 1.19mm/px · z∈[-52,+185]mm · 2 of 34 slices shown (2 of 2)]
[im 1/34]
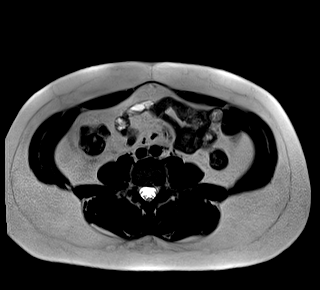
[im 34/34]
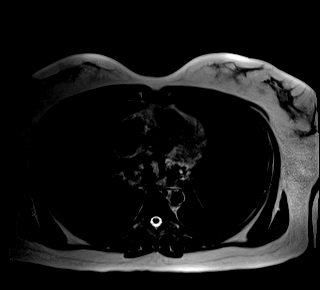

[Series 5: T1 · axial · 6.0mm · 0.74mm/px · z∈[-52,+185]mm · 3 of 68 slices shown]
[im 1/68]
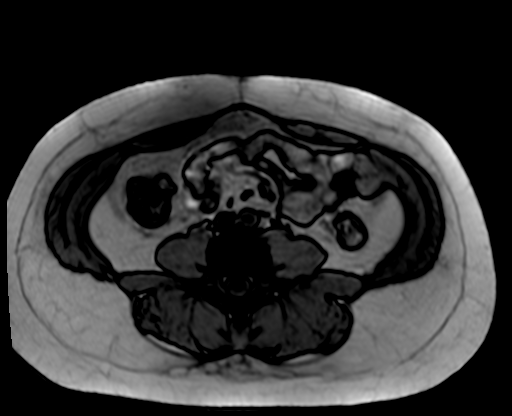
[im 34/68]
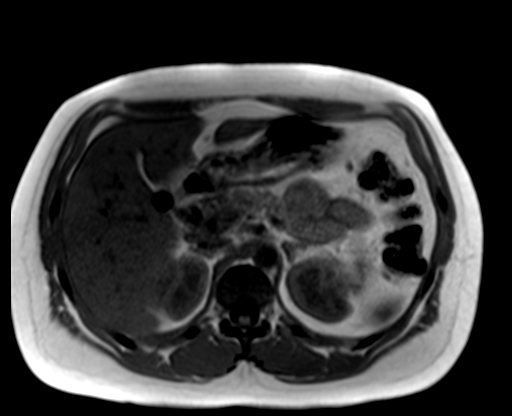
[im 68/68]
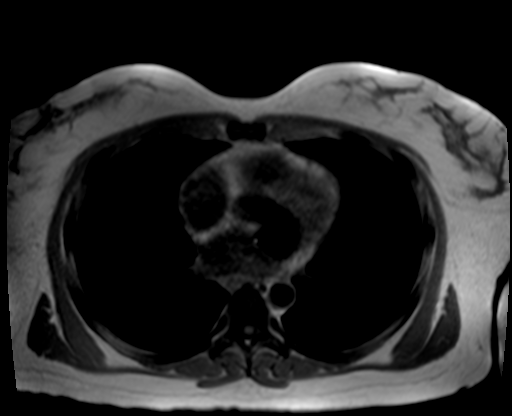

[Series 8: T2 fat-sat · axial · 6.0mm · 1.19mm/px · 1 of 33 slices shown]
[im 1/33]
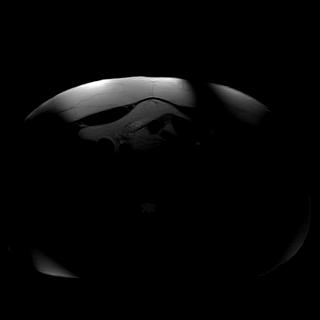

[Series 9: ax dwi_tracew · axial · 6.0mm · 1.42mm/px · z∈[-54,+184]mm · 4 of 102 slices shown]
[im 1/102]
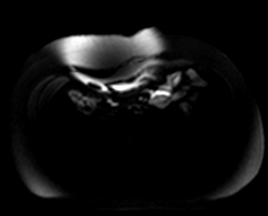
[im 34/102]
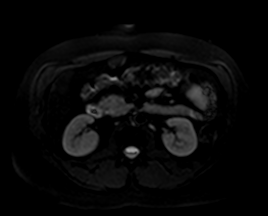
[im 68/102]
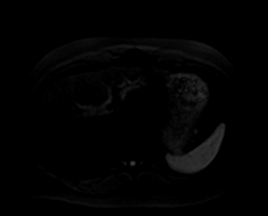
[im 102/102]
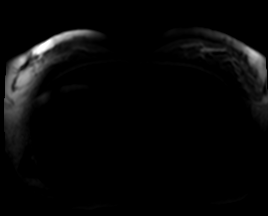

[Series 10: ax dwi_adc · axial · 6.0mm · 1.42mm/px · 1 of 34 slices shown]
[im 1/34]
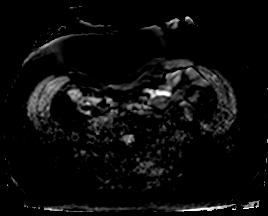

[Series 11: MRCP · coronal · 3.0mm · 1.12mm/px · 1 of 17 slices shown]
[im 1/17]
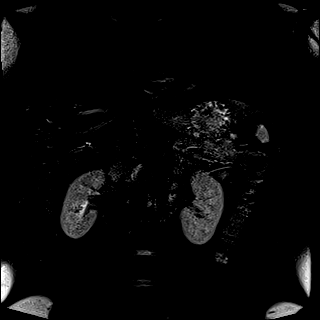

[Series 16: radials · coronal · 50.0mm · 0.78mm/px · 1 of 4 slices shown]
[im 1/4]
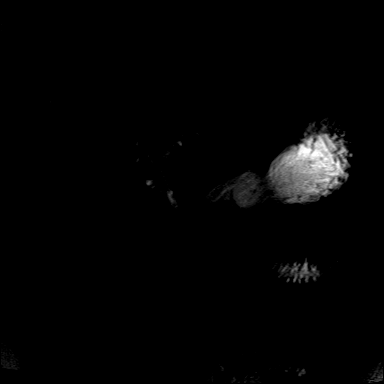

[Series 17: T1 dynamic fat-sat · axial · non-contrast · 3.0mm · 1.19mm/px · z∈[-25,+188]mm · 3 of 72 slices shown (1 of 5)]
[im 1/72]
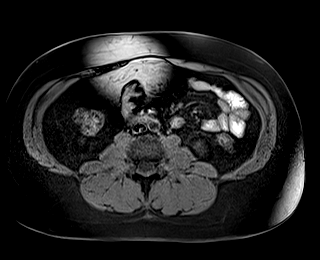
[im 36/72]
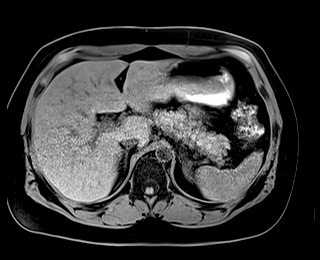
[im 72/72]
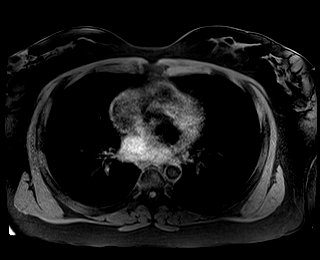

[Series 18: T1 dynamic fat-sat post-contrast · axial · 3.0mm · 1.19mm/px · z∈[-25,+188]mm · 3 of 72 slices shown (1 of 4)]
[im 1/72]
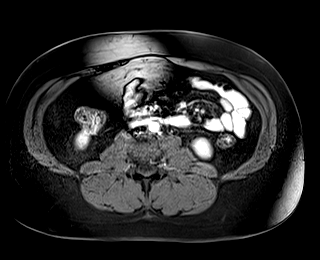
[im 36/72]
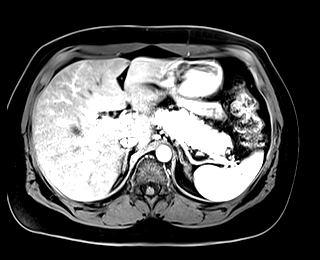
[im 72/72]
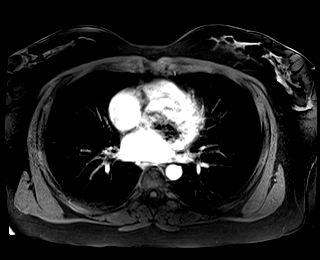

[Series 19: T1 dynamic fat-sat · axial · 3.0mm · 1.19mm/px · z∈[-25,+188]mm · 3 of 72 slices shown (2 of 5)]
[im 1/72]
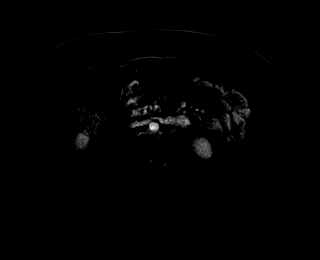
[im 36/72]
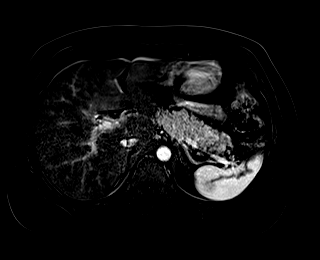
[im 72/72]
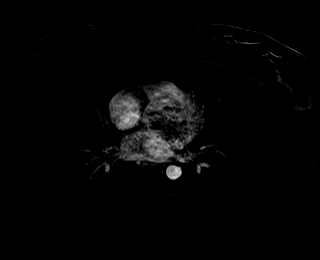

[Series 20: T1 dynamic fat-sat post-contrast · axial · 3.0mm · 1.19mm/px · z∈[-25,+188]mm · 3 of 72 slices shown (2 of 4)]
[im 1/72]
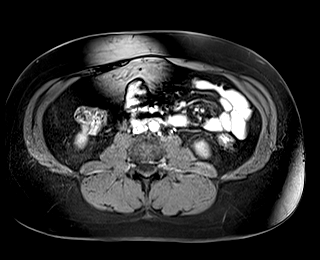
[im 36/72]
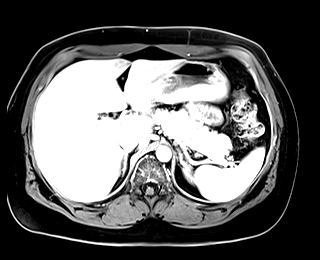
[im 72/72]
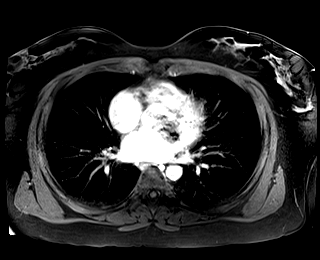

[Series 21: T1 dynamic fat-sat · axial · 3.0mm · 1.19mm/px · z∈[-25,+188]mm · 3 of 72 slices shown (3 of 5)]
[im 1/72]
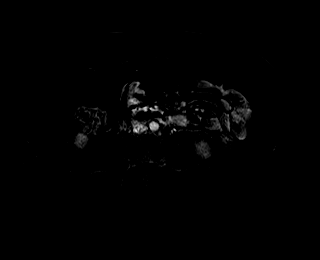
[im 36/72]
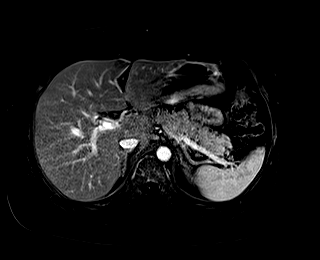
[im 72/72]
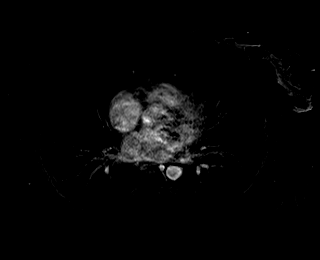

[Series 22: T1 dynamic fat-sat post-contrast · axial · 3.0mm · 1.19mm/px · z∈[-25,+188]mm · 3 of 72 slices shown (3 of 4)]
[im 1/72]
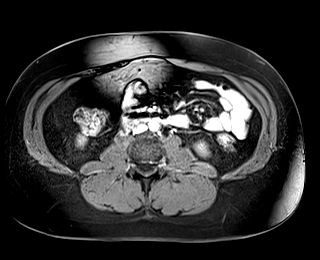
[im 36/72]
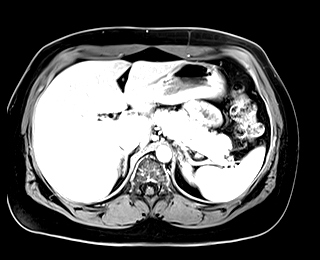
[im 72/72]
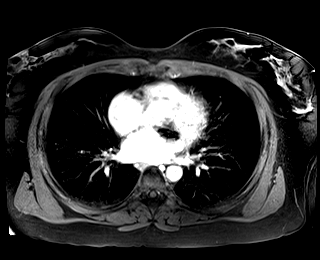

[Series 23: T1 dynamic fat-sat · axial · 3.0mm · 1.19mm/px · z∈[-25,+188]mm · 3 of 72 slices shown (4 of 5)]
[im 1/72]
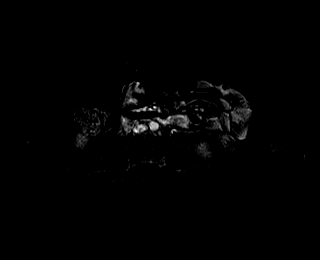
[im 36/72]
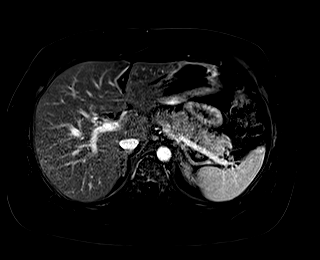
[im 72/72]
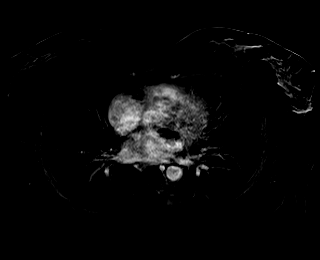

[Series 24: T1 dynamic post-contrast · coronal · 3.0mm · 1.31mm/px · 3 of 72 slices shown]
[im 1/72]
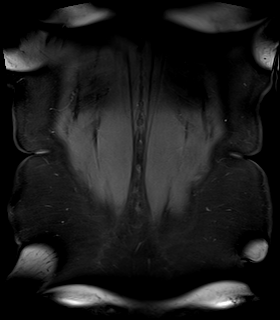
[im 36/72]
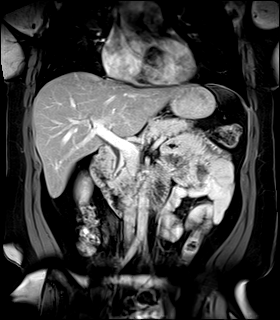
[im 72/72]
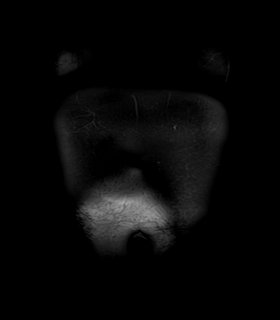

[Series 25: T1 dynamic fat-sat post-contrast · axial · 3.0mm · 1.19mm/px · z∈[-25,+188]mm · 3 of 72 slices shown (4 of 4)]
[im 1/72]
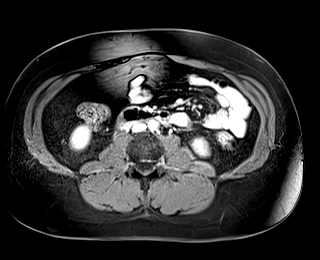
[im 36/72]
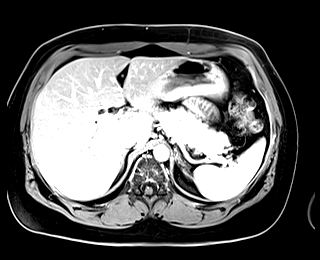
[im 72/72]
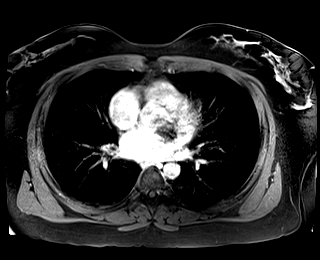

[Series 26: T1 dynamic fat-sat · axial · 3.0mm · 1.19mm/px · z∈[-25,+188]mm · 3 of 72 slices shown (5 of 5)]
[im 1/72]
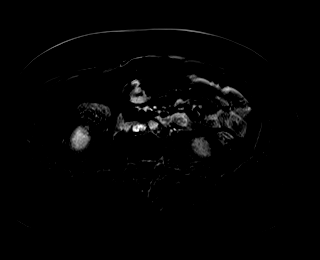
[im 36/72]
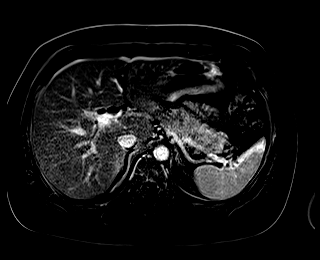
[im 72/72]
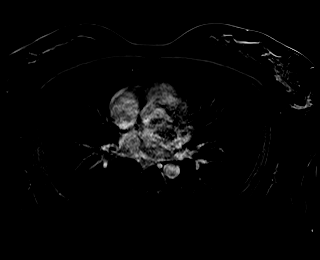

[45 of 48 positions shown; findings below may reference images not displayed]

FINDINGS: Lower chest: Incidental imaging of the lung bases is unremarkable
with limited assessment on MRI.

Hepatobiliary: Mild iron deposition in the liver without focal
lesion. Post cholecystectomy. Mild prominence of common bile duct
compatible with post cholecystectomy changes. No filling defect in
the biliary tree.

Postcontrast images with question of diffuse mild enhancement of the
biliary tree best seen on coronal images, image 41 of series 24.

Mild irregularity of the biliary tree is suggested on the
high-resolution MRCP sequences and ducts are seen to the periphery
beyond what is expected for simple post cholecystectomy changes.

Pancreas: Mild increased T2 signal about the pancreas and diminished
intrinsic T1 signal. No peripancreatic stranding or fluid
collection. Normal enhancement of the gland.

Spleen:  Normal

Adrenals/Urinary Tract:  Adrenal glands are normal.

Symmetric renal enhancement with small renal cyst in the upper pole
the LEFT kidney. Mild chronic scarring of the LEFT kidney upper
pole.

Stomach/Bowel: No acute gastrointestinal process to the extent
evaluated. Pelvic bowel loops not imaged. Study not performed for
bowel evaluation.

Vascular/Lymphatic: Patent abdominal vessels. There is no
gastrohepatic or hepatoduodenal ligament lymphadenopathy. No
retroperitoneal or mesenteric lymphadenopathy.

Other: Signs of mitral valve replacement. Susceptibility artifact
about the heart related to this procedure.

Musculoskeletal: Mild signal loss related to the marrow spaces of
the spine relative to expected signal within the spine as compared
to muscle on in phase T1 weighted gradient echo sequences. Low
signal on T2 as well.
IMPRESSION: 1. Mild prominence of common bile duct could be seen with post
cholecystectomy changes. However, there are signs of peribiliary
enhancement and dilated ductules to the periphery with some
irregularity that raises the question of cholangitis/cholangiopathy.
Correlate with clinical symptoms. This appearance could be seen in
the setting of PSC as well but is nonspecific particularly in the
acute setting. Would consider GI follow-up with follow-up imaging as
warranted.
2. No filling defect within the biliary tree. Additional
consideration for above findings would include recent stone passage.
3. Mild irregularity of the pancreatic duct may be related to
pancreatitis and or prior episodes of pancreatitis. Current findings
of mild interstitial edematous pancreatitis.
4. Given constellation of above findings would also include the
possibility of IgG 4 related disease/autoimmune pancreatitis there
prominence of the pancreatic duct would be atypical for this
entity.
5. Signs of iron deposition, mild in the liver with evidence of iron
deposition in marrow spaces of the spine as well. Correlate with any
history of anemia and longstanding iron replacement.

## 2020-04-04 MED ORDER — ONDANSETRON HCL 4 MG/2ML IJ SOLN
4.0000 mg | Freq: Once | INTRAMUSCULAR | Status: AC
  Administered 2020-04-04: 4 mg via INTRAVENOUS
  Filled 2020-04-04: qty 2

## 2020-04-04 MED ORDER — HEPARIN (PORCINE) 25000 UT/250ML-% IV SOLN
1050.0000 [IU]/h | INTRAVENOUS | Status: DC
  Administered 2020-04-04: 22:00:00 1200 [IU]/h via INTRAVENOUS
  Filled 2020-04-04: qty 250

## 2020-04-04 MED ORDER — WARFARIN SODIUM 10 MG PO TABS
10.0000 mg | ORAL_TABLET | Freq: Once | ORAL | Status: AC
  Administered 2020-04-04: 10 mg via ORAL
  Filled 2020-04-04: qty 1

## 2020-04-04 MED ORDER — PANTOPRAZOLE SODIUM 40 MG PO TBEC
40.0000 mg | DELAYED_RELEASE_TABLET | Freq: Every day | ORAL | Status: DC
Start: 2020-04-04 — End: 2020-04-05
  Administered 2020-04-04: 40 mg via ORAL
  Filled 2020-04-04 (×2): qty 1

## 2020-04-04 MED ORDER — ACETAMINOPHEN 325 MG PO TABS: 325.0000 mg | ORAL_TABLET | Freq: Four times a day (QID) | ORAL | Status: DC | PRN

## 2020-04-04 MED ORDER — AMLODIPINE BESYLATE 10 MG PO TABS
10.0000 mg | ORAL_TABLET | Freq: Every day | ORAL | Status: DC
  Administered 2020-04-04: 10 mg via ORAL
  Filled 2020-04-04: qty 2
  Filled 2020-04-04: qty 1

## 2020-04-04 MED ORDER — ONDANSETRON HCL 4 MG/2ML IJ SOLN
4.0000 mg | Freq: Four times a day (QID) | INTRAMUSCULAR | Status: DC | PRN
  Administered 2020-04-04: 4 mg via INTRAVENOUS
  Filled 2020-04-04: qty 2

## 2020-04-04 MED ORDER — LACTATED RINGERS IV BOLUS
1000.0000 mL | Freq: Once | INTRAVENOUS | Status: AC
  Administered 2020-04-04: 1000 mL via INTRAVENOUS

## 2020-04-04 MED ORDER — WARFARIN SODIUM 5 MG PO TABS: 5.0000 mg | ORAL_TABLET | Freq: Every day | ORAL | Status: DC

## 2020-04-04 MED ORDER — LACTATED RINGERS IV SOLN: INTRAVENOUS | Status: AC

## 2020-04-04 MED ORDER — MORPHINE SULFATE (PF) 2 MG/ML IV SOLN
2.0000 mg | INTRAVENOUS | Status: DC | PRN
  Administered 2020-04-04: 2 mg via INTRAVENOUS
  Filled 2020-04-04: qty 1

## 2020-04-04 MED ORDER — GADOBUTROL 1 MMOL/ML IV SOLN
7.5000 mL | Freq: Once | INTRAVENOUS | Status: AC | PRN
  Administered 2020-04-04: 7.5 mL via INTRAVENOUS
  Filled 2020-04-04: qty 7.5

## 2020-04-04 MED ORDER — ASPIRIN 81 MG PO CHEW
81.0000 mg | CHEWABLE_TABLET | Freq: Every day | ORAL | Status: DC
Start: 2020-04-04 — End: 2020-04-05
  Administered 2020-04-04: 81 mg via ORAL
  Filled 2020-04-04 (×2): qty 1

## 2020-04-04 MED ORDER — ACETAMINOPHEN 650 MG RE SUPP: 325.0000 mg | Freq: Four times a day (QID) | RECTAL | Status: DC | PRN

## 2020-04-04 MED ORDER — METRONIDAZOLE 500 MG PO TABS
500.0000 mg | ORAL_TABLET | Freq: Three times a day (TID) | ORAL | Status: DC
  Administered 2020-04-04 – 2020-04-05 (×2): 500 mg via ORAL
  Filled 2020-04-04 (×4): qty 1

## 2020-04-04 MED ORDER — MORPHINE SULFATE (PF) 4 MG/ML IV SOLN
4.0000 mg | Freq: Once | INTRAVENOUS | Status: AC
Start: 2020-04-04 — End: 2020-04-04
  Administered 2020-04-04: 4 mg via INTRAVENOUS
  Filled 2020-04-04: qty 1

## 2020-04-04 MED ORDER — ALBUTEROL SULFATE HFA 108 (90 BASE) MCG/ACT IN AERS
2.0000 | INHALATION_SPRAY | RESPIRATORY_TRACT | Status: DC | PRN
  Filled 2020-04-04: qty 6.7

## 2020-04-04 MED ORDER — GABAPENTIN 300 MG PO CAPS
600.0000 mg | ORAL_CAPSULE | Freq: Two times a day (BID) | ORAL | Status: DC
  Administered 2020-04-04: 600 mg via ORAL
  Filled 2020-04-04 (×2): qty 2

## 2020-04-04 MED ORDER — SODIUM CHLORIDE 0.9 % IV SOLN: Freq: Once | INTRAVENOUS | Status: AC

## 2020-04-04 MED ORDER — ALBUTEROL SULFATE (2.5 MG/3ML) 0.083% IN NEBU
2.5000 mg | INHALATION_SOLUTION | Freq: Three times a day (TID) | RESPIRATORY_TRACT | Status: DC
  Filled 2020-04-04: qty 3

## 2020-04-04 MED ORDER — ONDANSETRON HCL 4 MG PO TABS: 4.0000 mg | ORAL_TABLET | Freq: Four times a day (QID) | ORAL | Status: DC | PRN

## 2020-04-04 MED ORDER — CARVEDILOL 6.25 MG PO TABS
3.1250 mg | ORAL_TABLET | Freq: Two times a day (BID) | ORAL | Status: DC
  Administered 2020-04-05: 3.125 mg via ORAL
  Filled 2020-04-04: qty 1

## 2020-04-04 MED ORDER — WARFARIN SODIUM 4 MG PO TABS: 4.0000 mg | ORAL_TABLET | Freq: Every day | ORAL | Status: DC

## 2020-04-04 MED ORDER — WARFARIN - PHARMACIST DOSING INPATIENT
Freq: Every day | Status: DC
  Filled 2020-04-04: qty 1

## 2020-04-04 MED ORDER — HEPARIN BOLUS VIA INFUSION
4000.0000 [IU] | Freq: Once | INTRAVENOUS | Status: AC
  Administered 2020-04-04: 4000 [IU] via INTRAVENOUS
  Filled 2020-04-04: qty 4000

## 2020-04-04 MED ORDER — DIAZEPAM 5 MG PO TABS: 5.0000 mg | ORAL_TABLET | Freq: Two times a day (BID) | ORAL | Status: DC | PRN

## 2020-04-04 MED ORDER — CEFAZOLIN SODIUM-DEXTROSE 1-4 GM/50ML-% IV SOLN
1.0000 g | Freq: Three times a day (TID) | INTRAVENOUS | Status: DC
  Administered 2020-04-04 – 2020-04-05 (×2): 1 g via INTRAVENOUS
  Filled 2020-04-04 (×4): qty 50

## 2020-04-04 MED ORDER — ATORVASTATIN CALCIUM 20 MG PO TABS
20.0000 mg | ORAL_TABLET | Freq: Every day | ORAL | Status: DC
  Administered 2020-04-04: 20 mg via ORAL
  Filled 2020-04-04 (×2): qty 1

## 2020-04-04 NOTE — ED Notes (Signed)
Phlebotomy to bedside to assist with 2nd set of cultures.

## 2020-04-04 NOTE — ED Notes (Signed)
Pt spoke with MRI

## 2020-04-04 NOTE — ED Provider Notes (Signed)
[facility] Emergency Department Provider Note   ____________________________________________    I have reviewed the triage vital signs and the nursing notes.   HISTORY  Chief Complaint Abdominal Pain     HPI Anne Kirby is a 40 y.o. female presents with complaints of periumbilical abdominal pain with right upper quadrant pain as well.  Patient was at [facility] last night and was going to have an MRCP however the MRI machine was broken, she was not willing to wait overnight for transfer to [facility] so she has presented to our hospital.  She continues to have pain and is here requesting MRCP  Past Medical History:  Diagnosis Date  . Abnormal Pap smear of cervix   . Asthma   . Dyspnea   . Heart disease    cardial murmur  . Hyperlipidemia   . Hypertension   . Mitral regurgitation   . Trichomoniasis     Patient Active Problem List   Diagnosis Date Noted  . Sprain of medial collateral ligament of right knee 07/09/2016  . Severe mitral regurgitation 07/02/2016  . Migraine headache 07/02/2016  . Atrial fibrillation, transient ([place]) 07/02/2016  . Acid reflux 01/24/2014  . Hyperlipidemia, mixed 08/24/2013  . Current tobacco use 08/24/2013    Past Surgical History:  Procedure Laterality Date  . MITRAL VALVE REPLACEMENT  11/20/13    Prior to Admission medications   Medication Sig Start Date End Date Taking? Authorizing Provider  albuterol (PROVENTIL HFA;VENTOLIN HFA) 108 (90 Base) MCG/ACT inhaler Inhale 2 puffs into the lungs every 4 (four) hours as needed for wheezing or shortness of breath.    [provider]  albuterol (PROVENTIL) (2.5 MG/3ML) 0.083% nebulizer solution Take 2.5 mg by nebulization 3 (three) times daily.    [provider]  amLODipine (NORVASC) 10 MG tablet Take 10 mg by mouth daily.  01/19/17   [provider]  aspirin 81 MG chewable tablet Chew 81 mg by mouth daily.    [provider]  atorvastatin (LIPITOR) 20 MG tablet Take 20 mg by mouth daily. 05/01/19   [provider]  carvedilol (COREG) 3.125 MG tablet Take 3.125 mg by mouth 2 (two) times daily. 05/01/19   [provider]  cephALEXin (KEFLEX) 500 MG capsule Take 1 capsule (500 mg total) by mouth 4 (four) times daily. Patient not taking: No sig reported 05/13/19   Milton Ferguson, MD  cyclobenzaprine (FLEXERIL) 10 MG tablet Take 10 mg by mouth 3 (three) times daily as needed for muscle spasms.    [provider]  diazepam (VALIUM) 5 MG tablet Take 5 mg by mouth every 12 (twelve) hours as needed. 11/02/19   [provider]  ferrous sulfate 324 (65 Fe) MG TBEC Take 1 tablet by mouth daily.    [provider]  FLUTICASONE PROPIONATE, INHAL, IN Inhale into the lungs daily.    [provider]  gabapentin (NEURONTIN) 300 MG capsule Take 600 mg by mouth 2 (two) times daily.     [provider]  ondansetron (ZOFRAN ODT) 4 MG disintegrating tablet Take 1 tablet (4 mg total) by mouth every 8 (eight) hours as needed for nausea or vomiting. 04/03/20   Evalee Jefferson, PA-C  pantoprazole (PROTONIX) 40 MG tablet Take 40 mg by mouth daily.    [provider]  promethazine (PHENERGAN) 25 MG suppository Place 1 suppository (25 mg total) rectally every 6 (six) hours as needed for nausea or vomiting. Patient not taking:  No sig reported 05/13/19   Milton Ferguson, MD  sulfamethoxazole-trimethoprim (BACTRIM DS) 800-160 MG tablet Take 1 tablet by mouth 2 (two) times daily. Patient not taking: No sig reported 03/26/20   [provider]  SUMAtriptan (IMITREX) 100 MG tablet Take 100 mg by mouth every 2 (two) hours as needed for migraine. May repeat in 2 hours if headache persists or recurs.    [provider]  warfarin (COUMADIN) 4 MG tablet Take 4 mg by mouth. Takes 2 tabs once a day on Thursday, Friday, Saturday, Sunday.    [provider]  warfarin (COUMADIN)  5 MG tablet Take 5 mg by mouth daily. Take 1 tablet daily on Monday, Tuesday, and Wednesday.    [provider]     Allergies Tramadol  Family History  Problem Relation Age of Onset  . Diabetes Mother   . Heart disease Father   . Heart attack Father   . Hypertension Other   . Stroke Other     Social History Social History   Tobacco Use  . Smoking status: Current Every Day Smoker    Packs/day: 0.25    Years: 15.00    Pack years: 3.75    Types: Cigarettes  . Smokeless tobacco: Never Used  Substance Use Topics  . Alcohol use: No  . Drug use: Yes    Types: Marijuana    Comment: occasional    Review of Systems  Constitutional: No fever/chills Eyes: No visual changes.  ENT: No sore throat. Cardiovascular: Denies chest pain. Respiratory: Denies shortness of breath. Gastrointestinal: As above Genitourinary: Negative for dysuria. Musculoskeletal: Negative for back pain. Skin: Negative for rash. Neurological: Negative for headaches or weakness   ____________________________________________   PHYSICAL EXAM:  VITAL SIGNS: ED Triage Vitals  Enc Vitals Group     BP 04/04/20 0940 127/76     Pulse Rate 04/04/20 0940 97     Resp 04/04/20 0940 16     Temp 04/04/20 0940 98.8 F (37.1 C)     Temp Source 04/04/20 0940 Oral     SpO2 04/04/20 0940 99 %     Weight 04/04/20 0942 81.6 kg (180 lb)     Height 04/04/20 0942 1.702 m (5\' 7" )     Head Circumference --      Peak Flow --      Pain Score 04/04/20 0941 8     Pain Loc --      Pain Edu? --      Excl. in [place]? --     Constitutional: Alert and oriented. No acute distress. Pleasant and interactive  Mouth/Throat: Mucous membranes are moist.   Neck:  Painless ROM Cardiovascular: Normal rate, regular rhythm. Grossly normal heart sounds.  Good peripheral circulation. Respiratory: Normal respiratory effort.  No retractions. Lungs CTAB. Gastrointestinal: Soft, very minimal right upper quadrant tenderness, mild  left lower quadrant tenderness as well  Musculoskeletal: No lower extremity tenderness nor edema.  Warm and well perfused Neurologic:  Normal speech and language. No gross focal neurologic deficits are appreciated.  Skin:  Skin is warm, dry and intact. No rash noted. Psychiatric: Mood and affect are normal. Speech and behavior are normal.  ____________________________________________   LABS (all labs ordered are listed, but only abnormal results are displayed)  Labs Reviewed  LIPASE, BLOOD - Abnormal; Notable for the following components:      Result Value   Lipase 137 (*)    All other components within normal limits  COMPREHENSIVE METABOLIC PANEL -  Abnormal; Notable for the following components:   Glucose, Bld 155 (*)    AST 136 (*)    ALT 297 (*)    Alkaline Phosphatase 386 (*)    All other components within normal limits  CBC  IGG 4  TRIGLYCERIDES  ANTI-SMOOTH MUSCLE ANTIBODY, IGG  ANA  ANTI-MICROSOMAL ANTIBODY LIVER / KIDNEY  HEPATITIS A ANTIBODY, IGM  HEPATITIS B CORE ANTIBODY, IGM  HEPATITIS B DNA, ULTRAQUANTITATIVE, PCR  HEPATITIS B E ANTIGEN  HEPATITIS B SURFACE ANTIGEN  HEPATITIS C ANTIBODY  HEPATITIS C GENOTYPE  EBV AB TO VIRAL CAPSID AG PNL, IGG+IGM  HSV(HERPES SIMPLEX VRS) I + II AB-IGM  CMV DNA, QUANTITATIVE, PCR  VARICELLA-ZOSTER BY PCR   ____________________________________________  EKG  None ____________________________________________  RADIOLOGY  Pending MRCP ____________________________________________   PROCEDURES  Procedure(s) performed: No  Procedures   Critical Care performed: No ____________________________________________   INITIAL IMPRESSION / ASSESSMENT AND PLAN / ED COURSE  Pertinent labs & imaging results that were available during my care of the patient were reviewed by me and considered in my medical decision making (see chart for details).  Patient unable to obtain her MRCP yesterday, continues to have pain today.   Elevated LFTs however normal T bili.  Will obtain MRCP  Have asked Dr. Tamala Julian to follow-up on results    ____________________________________________   FINAL CLINICAL IMPRESSION(S) / ED DIAGNOSES  Final diagnoses:  RUQ pain        Note:  This document was prepared using Dragon voice recognition software and may include unintentional dictation errors.   Lavonia Drafts, MD 04/04/20 1430

## 2020-04-04 NOTE — Consult Note (Addendum)
[facility] for Warfarin with IV Heparin bridge until therapeutic Indication: Mechanical mitral valve   Heparin dosing weight: 78.4 kg  Labs: Recent Labs    04/03/20 1330 04/04/20 0945  HGB 14.5 13.1  HCT 43.7 39.5  PLT 338 326  LABPROT 17.0*  --   INR 1.4*  --   CREATININE 0.85 0.71    Estimated Creatinine Clearance: 101.7 mL/min (by C-G formula based on SCr of 0.71 mg/dL).   Medical History: Past Medical History:  Diagnosis Date  . Abnormal Pap smear of cervix   . Asthma   . Dyspnea   . Heart disease    cardial murmur  . Hyperlipidemia   . Hypertension   . Mitral regurgitation   . Trichomoniasis    Medications:  Warfarin 5 mg SuTh, 6.5 mg TuSa, 8 mg MWF (see cardiology office telephone encounter 03/12/20)  Medication reconciliation technician was unable to verify dosing regimen with patient. Patient reports taking 8 mg and 5 mg doses on varying days.   Patient only appears to fill warfarin 5 mg and warfarin 4 mg tablets. Unclear how patient would take a 6.5 mg dose. Attempted to call cardiology office but they were closed. Will need to follow-up next week.  Assessment: Patient is a 40 y/o F with medical history as above and including rheumatic fever status post mechanical mitral valve replacement on warfarin who presented to the ED 1/14 with LLQ abdominal pain. Patient was apparently taking Bactrim recently for a UTI and has completed the prescription. Pharmacy consulted to manage warfarin.  Albumin: 4.1  Date INR Plan  1/14 1.4 Warfarin 10 mg   Goal of Therapy:  INR 2.5 - 3.5   Plan:  Warfarin --INR is subtherapeutic. Will given warfarin 10 mg x 1 dose tonight. Patient reports she has missed several doses as she was NPO --Daily INR per protocol  Heparin --4000 unit IV heparin bolus x 1 followed by continuous infusion at 1200 units/hr --Will check HL 6 hours after initiation of infusion --Daily CBC per protocol while  on heparin infusion --Bridge until therapeutic on warfarin  Benita Gutter 04/04/2020,3:31 PM

## 2020-04-04 NOTE — H&P (Signed)
History and Physical   Anne Kirby RSW:[id number] DOB: [birth year] DOA: 04/04/2020  PCP: [facility], [facility] Personal  Outpatient Specialists: [facility], Dr. Drema Dallas, Cardiology Patient coming from: home  I have personally briefly reviewed patient's old medical records in [place].  Chief Concern: Abdominal pain, nausea, vomiting  HPI: Anne Kirby is a 40 y.o. 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  She reports the abdominal pain that started about two weeks. The right back/ribs pain wraps to the RUQ. She describes the pain as a burning pain, 10/10 and now it is a 4 after pain medication. She states the pain is worst with eating and is persistent. She reports the pain last minutes if she is on an empty stomach. She states she can tolerated the boost.   She endorses associated nausea and vomiting.  She states she has had anorexia for two days. She states the blue emesis bag is almost full yesterday and Anne Kirby.   Social history: She lives at home with her 2 children. She works at [employer]. She smokes 2 cigarettes/day, denies EtOH, and endorses infrequent marijuana smoking.  ROS: Constitutional: no weight change, no fever ENT/Mouth: no sore throat, no rhinorrhea Eyes: no eye pain, no vision changes Cardiovascular: no chest pain, + dyspnea,  no edema, no palpitations Respiratory: no cough, no sputum, no wheezing Gastrointestinal: + nausea, + vomiting, no diarrhea, no constipation Genitourinary: no urinary incontinence, no dysuria, no hematuria Musculoskeletal: no arthralgias, no myalgias Skin: no skin lesions, no pruritus, Neuro: + weakness, no loss of consciousness, no syncope Psych: no anxiety, no depression, +  decrease appetite Heme/Lymph: no bruising, no bleeding  ED Course: Discussed with ED provider, patient requiring hospitalization for abdominal pain concerning for PSC versus infectious etiology.  GI recommended admit patient for infectious work-up.  Vital signs in the ED were reassuring.  Labs were unremarkable except for hyperglycemia, elevated alk phos, elevated AST and ALT which have improved since CMP at [facility], on 04/03/2020.  Assessment/Plan  Principal Problem:   Abdominal pain Active Problems:   Severe mitral regurgitation   Hyperlipidemia, mixed   Current tobacco use   Atrial fibrillation, transient (HCC)   Essential hypertension   Abdominal pain -  -GI was consulted and suspect patient had passed the stone with mild degree of cholangitis - GI recommended infection work-up and antibiotics as patient had fever a couple of days ago - Cefazolin 1 g IV every 8 hours for 1 day - Metronidazole 500 mg p.o. every 8 hours for 1 day - Blood cultures obtained -UA was negative at any pain  PSC work-up per GI including HSV, varicella-zoster, IgG4, hepatitis panel, CMV, EBV, anti-smooth muscle antibody, ANA, antimicrosomal antibody  Hypertension-controlled, resumed amlodipine 10 mg daily, Coreg 3.25 twice daily  Hyperlipidemia-resumed home atorvastatin 20 mg nightly  Mitral valve repair-on warfarin - Subtherapeutic - Warfarin pharmacy consulted - Heparin pharmacy consulted for bridging  Chart reviewed.   DVT prophylaxis: Heparin GTT Code Status: Full code Diet: Cardiac Family Communication: No Disposition Plan: Pending clinical course Consults called: GI Admission status: Observation with telemetry  Past Medical History:  Diagnosis Date  . Abnormal Pap smear of cervix   . Asthma   . Dyspnea   . Heart disease    cardial murmur  . Hyperlipidemia   .  Hypertension   . Mitral regurgitation   . Trichomoniasis    Past Surgical History:  Procedure Laterality Date  .  MITRAL VALVE REPLACEMENT  11/20/13   Social History:  reports that she has been smoking cigarettes. She has a 3.75 pack-year smoking history. She has never used smokeless tobacco. She reports current drug use. Drug: Marijuana. She reports that she does not drink alcohol.  Allergies  Allergen Reactions  . Bactrim [Sulfamethoxazole-Trimethoprim] Rash  . Tramadol Hives  . Latex Rash   Family History  Problem Relation Age of Onset  . Diabetes Mother   . Heart disease Father   . Heart attack Father   . Hypertension Other   . Stroke Other    Family history: Family history reviewed and not pertinent  Prior to Admission medications   Medication Sig Start Date End Date Taking? Authorizing Provider  albuterol (PROVENTIL HFA;VENTOLIN HFA) 108 (90 Base) MCG/ACT inhaler Inhale 2 puffs into the lungs every 4 (four) hours as needed for wheezing or shortness of breath.    [provider]  albuterol (PROVENTIL) (2.5 MG/3ML) 0.083% nebulizer solution Take 2.5 mg by nebulization 3 (three) times daily.    [provider]  amLODipine (NORVASC) 10 MG tablet Take 10 mg by mouth daily.  01/19/17   [provider]  aspirin 81 MG chewable tablet Chew 81 mg by mouth daily.    [provider]  atorvastatin (LIPITOR) 20 MG tablet Take 20 mg by mouth daily. 05/01/19   [provider]  carvedilol (COREG) 3.125 MG tablet Take 3.125 mg by mouth 2 (two) times daily. 05/01/19   [provider]  cephALEXin (KEFLEX) 500 MG capsule Take 1 capsule (500 mg total) by mouth 4 (four) times daily. Patient not taking: No sig reported 05/13/19   Milton Ferguson, MD  cyclobenzaprine (FLEXERIL) 10 MG tablet Take 10 mg by mouth 3 (three) times daily as needed for muscle spasms.    [provider]  diazepam (VALIUM) 5 MG tablet Take 5 mg by mouth every 12 (twelve) hours as needed. 11/02/19   [provider]  ferrous sulfate 324 (65 Fe) MG TBEC Take 1 tablet by mouth  daily.    [provider]  FLUTICASONE PROPIONATE, INHAL, IN Inhale into the lungs daily.    [provider]  gabapentin (NEURONTIN) 300 MG capsule Take 600 mg by mouth 2 (two) times daily.     [provider]  ondansetron (ZOFRAN ODT) 4 MG disintegrating tablet Take 1 tablet (4 mg total) by mouth every 8 (eight) hours as needed for nausea or vomiting. 04/03/20   Evalee Jefferson, PA-C  pantoprazole (PROTONIX) 40 MG tablet Take 40 mg by mouth daily.    [provider]  promethazine (PHENERGAN) 25 MG suppository Place 1 suppository (25 mg total) rectally every 6 (six) hours as needed for nausea or vomiting. Patient not taking: No sig reported 05/13/19   Milton Ferguson, MD  SUMAtriptan (IMITREX) 100 MG tablet Take 100 mg by mouth every 2 (two) hours as needed for migraine. May repeat in 2 hours if headache persists or recurs.    [provider]  warfarin (COUMADIN) 4 MG tablet Take 4 mg by mouth. Takes 2 tabs once a day on Thursday, Friday, Saturday, Sunday.    [provider]  warfarin (COUMADIN) 5 MG tablet Take 5 mg by mouth daily. Take 1 tablet daily on Monday, Tuesday, and Wednesday.    [provider]   Physical Exam: Vitals:  04/04/20 2000 04/04/20 2009 04/04/20 2129 04/04/20 2138  BP: 134/82  119/82 119/82  Pulse: 80 76  72  Resp:    19  Temp:      TempSrc:      SpO2: 100% 100%  99%  Weight:      Height:       Constitutional: appears age-appropriate, NAD, calm, comfortable Eyes: PERRL, lids and conjunctivae normal ENMT: Mucous membranes are moist. Posterior pharynx clear of any exudate or lesions. Age-appropriate dentition. Hearing appropriate Neck: normal, supple, no masses, no thyromegaly Respiratory: clear to auscultation bilaterally, no wheezing, no crackles. Normal respiratory effort. No accessory muscle use.  Cardiovascular: Regular rate and rhythm, no murmurs / rubs / gallops. No extremity edema. 2+ pedal pulses. No  carotid bruits.  Abdomen: Obese abdomen, right upper quadrant tenderness, no masses palpated, no hepatosplenomegaly. Bowel sounds positive.  Musculoskeletal: no clubbing / cyanosis. No joint deformity upper and lower extremities. Good ROM, no contractures, no atrophy. Normal muscle tone.  Skin: no rashes, lesions, ulcers. No induration Neurologic: Sensation intact. Strength 5/5 in all 4.  Psychiatric: Normal judgment and insight. Alert and oriented x 3. Normal mood.   EKG: Not indicated  Imaging on Admission: I personally reviewed and I agree with radiologist reading as below.  CT ABDOMEN PELVIS W CONTRAST  Result Date: 04/03/2020 CLINICAL DATA:  Abdominal pain and fever. Constipation. Elevated liver enzymes. EXAM: CT ABDOMEN AND PELVIS WITH CONTRAST TECHNIQUE: Multidetector CT imaging of the abdomen and pelvis was performed using the standard protocol following bolus administration of intravenous contrast. CONTRAST:  15m OMNIPAQUE IOHEXOL 300 MG/ML  SOLN COMPARISON:  05/13/2019 FINDINGS: Lower chest: Normal Hepatobiliary: Previous cholecystectomy. Liver parenchyma is normal. Pancreas: Normal Spleen: Normal Adrenals/Urinary Tract: Adrenal glands are normal. Right kidney is normal. Chronic focal volume loss in the upper pole of the left kidney. No hydronephrosis. No stone disease. Bladder is normal. Stomach/Bowel: Stomach and small intestine are normal. The appendix is normal. Amount of stool in the colon is within normal limits. No diverticulosis or diverticulitis. Vascular/Lymphatic: Aortic atherosclerosis. No aneurysm. IVC is normal. No retroperitoneal adenopathy. Reproductive: Multiple small leiomyomas of the uterus. No adnexal lesion. Other: No free fluid or air.  No hernia. Musculoskeletal: Chronic degenerative change at L5-S1 which could be a cause of low back pain. IMPRESSION: 1. No acute finding to explain the clinical presentation. No evidence of bowel obstruction or inflammation. Amount of  stool in the colon is within normal limits. 2. Previous cholecystectomy. 3. Chronic focal volume loss in the upper pole of the left kidney. 4. Aortic atherosclerosis. 5. Multiple small leiomyomas of the uterus. 6. Chronic degenerative change at L5-S1 which could be a cause of low back pain. Aortic Atherosclerosis (ICD10-I70.0). Electronically Signed   By: MNelson ChimesM.D.   On: 04/03/2020 16:43   MR 3D Recon At Scanner  Result Date: 04/04/2020 CLINICAL DATA:  Abdominal pain, suspected biliary obstruction in this 40year old female. EXAM: MRI ABDOMEN WITHOUT AND WITH CONTRAST (INCLUDING MRCP) TECHNIQUE: Multiplanar multisequence MR imaging of the abdomen was performed both before and after the administration of intravenous contrast. Heavily T2-weighted images of the biliary and pancreatic ducts were obtained, and three-dimensional MRCP images were rendered by post processing. CONTRAST:  7.587mGADAVIST GADOBUTROL 1 MMOL/ML IV SOLN COMPARISON:  CT evaluation of April 03, 2020 FINDINGS: Lower chest: Incidental imaging of the lung bases is unremarkable with limited assessment on MRI. Hepatobiliary: Mild iron deposition in the liver without focal lesion. Post cholecystectomy. Mild prominence of  common bile duct compatible with post cholecystectomy changes. No filling defect in the biliary tree. Postcontrast images with question of diffuse mild enhancement of the biliary tree best seen on coronal images, image 41 of series 24. Mild irregularity of the biliary tree is suggested on the high-resolution MRCP sequences and ducts are seen to the periphery beyond what is expected for simple post cholecystectomy changes. Pancreas: Mild increased T2 signal about the pancreas and diminished intrinsic T1 signal. No peripancreatic stranding or fluid collection. Normal enhancement of the gland. Spleen:  Normal Adrenals/Urinary Tract:  Adrenal glands are normal. Symmetric renal enhancement with small renal cyst in the upper pole  the LEFT kidney. Mild chronic scarring of the LEFT kidney upper pole. Stomach/Bowel: No acute gastrointestinal process to the extent evaluated. Pelvic bowel loops not imaged. Study not performed for bowel evaluation. Vascular/Lymphatic: Patent abdominal vessels. There is no gastrohepatic or hepatoduodenal ligament lymphadenopathy. No retroperitoneal or mesenteric lymphadenopathy. Other: Signs of mitral valve replacement. Susceptibility artifact about the heart related to this procedure. Musculoskeletal: Mild signal loss related to the marrow spaces of the spine relative to expected signal within the spine as compared to muscle on in phase T1 weighted gradient echo sequences. Low signal on T2 as well. IMPRESSION: 1. Mild prominence of common bile duct could be seen with post cholecystectomy changes. However, there are signs of peribiliary enhancement and dilated ductules to the periphery with some irregularity that raises the question of cholangitis/cholangiopathy. Correlate with clinical symptoms. This appearance could be seen in the setting of [place] as well but is nonspecific particularly in the acute setting. Would consider GI follow-up with follow-up imaging as warranted. 2. No filling defect within the biliary tree. Additional consideration for above findings would include recent stone passage. 3. Mild irregularity of the pancreatic duct may be related to pancreatitis and or prior episodes of pancreatitis. Current findings of mild interstitial edematous pancreatitis. 4. Given constellation of above findings would also include the possibility of IgG 4 related disease/autoimmune pancreatitis there prominence of the pancreatic duct would be atypical for this entity. 5. Signs of iron deposition, mild in the liver with evidence of iron deposition in marrow spaces of the spine as well. Correlate with any history of anemia and longstanding iron replacement. Electronically Signed   By: Zetta Bills M.D.   On: 04/04/2020  13:08   MR ABDOMEN MRCP W WO CONTAST  Result Date: 04/04/2020 CLINICAL DATA:  Abdominal pain, suspected biliary obstruction in this 40 year old female. EXAM: MRI ABDOMEN WITHOUT AND WITH CONTRAST (INCLUDING MRCP) TECHNIQUE: Multiplanar multisequence MR imaging of the abdomen was performed both before and after the administration of intravenous contrast. Heavily T2-weighted images of the biliary and pancreatic ducts were obtained, and three-dimensional MRCP images were rendered by post processing. CONTRAST:  7.52m GADAVIST GADOBUTROL 1 MMOL/ML IV SOLN COMPARISON:  CT evaluation of April 03, 2020 FINDINGS: Lower chest: Incidental imaging of the lung bases is unremarkable with limited assessment on MRI. Hepatobiliary: Mild iron deposition in the liver without focal lesion. Post cholecystectomy. Mild prominence of common bile duct compatible with post cholecystectomy changes. No filling defect in the biliary tree. Postcontrast images with question of diffuse mild enhancement of the biliary tree best seen on coronal images, image 41 of series 24. Mild irregularity of the biliary tree is suggested on the high-resolution MRCP sequences and ducts are seen to the periphery beyond what is expected for simple post cholecystectomy changes. Pancreas: Mild increased T2 signal about the pancreas and diminished intrinsic T1  signal. No peripancreatic stranding or fluid collection. Normal enhancement of the gland. Spleen:  Normal Adrenals/Urinary Tract:  Adrenal glands are normal. Symmetric renal enhancement with small renal cyst in the upper pole the LEFT kidney. Mild chronic scarring of the LEFT kidney upper pole. Stomach/Bowel: No acute gastrointestinal process to the extent evaluated. Pelvic bowel loops not imaged. Study not performed for bowel evaluation. Vascular/Lymphatic: Patent abdominal vessels. There is no gastrohepatic or hepatoduodenal ligament lymphadenopathy. No retroperitoneal or mesenteric lymphadenopathy.  Other: Signs of mitral valve replacement. Susceptibility artifact about the heart related to this procedure. Musculoskeletal: Mild signal loss related to the marrow spaces of the spine relative to expected signal within the spine as compared to muscle on in phase T1 weighted gradient echo sequences. Low signal on T2 as well. IMPRESSION: 1. Mild prominence of common bile duct could be seen with post cholecystectomy changes. However, there are signs of peribiliary enhancement and dilated ductules to the periphery with some irregularity that raises the question of cholangitis/cholangiopathy. Correlate with clinical symptoms. This appearance could be seen in the setting of [place] as well but is nonspecific particularly in the acute setting. Would consider GI follow-up with follow-up imaging as warranted. 2. No filling defect within the biliary tree. Additional consideration for above findings would include recent stone passage. 3. Mild irregularity of the pancreatic duct may be related to pancreatitis and or prior episodes of pancreatitis. Current findings of mild interstitial edematous pancreatitis. 4. Given constellation of above findings would also include the possibility of IgG 4 related disease/autoimmune pancreatitis there prominence of the pancreatic duct would be atypical for this entity. 5. Signs of iron deposition, mild in the liver with evidence of iron deposition in marrow spaces of the spine as well. Correlate with any history of anemia and longstanding iron replacement. Electronically Signed   By: Zetta Bills M.D.   On: 04/04/2020 13:08   Labs on Admission: I have personally reviewed following labs  CBC: Recent Labs  Lab 04/03/20 1330 04/04/20 0945  WBC 6.1 6.1  NEUTROABS 3.3  --   HGB 14.5 13.1  HCT 43.7 39.5  MCV 96.3 95.6  PLT 338 889   Basic Metabolic Panel:  Recent Labs  Lab 04/03/20 1330 04/04/20 0945  [place]by 136 137  K 4.6 4.1  CL 101 104  CO2 28 26  GLUCOSE 127* 155*  BUN 15  12  CREATININE 0.85 0.71  CALCIUM 10.1 9.5   GFR: Estimated Creatinine Clearance: 101.7 mL/min (by C-G formula based on SCr of 0.71 mg/dL).  Liver Function Tests: Recent Labs  Lab 04/03/20 1330 04/04/20 0945  AST 171* 136*  ALT 380* 297*  ALKPHOS 459* 386*  BILITOT 0.9 0.8  PROT 8.6* 7.7  ALBUMIN 4.4 4.1   Recent Labs  Lab 04/03/20 1330 04/04/20 0945  LIPASE 166* 137*   No results for input(s): AMMONIA in the last 168 hours. Coagulation Profile: Recent Labs  Lab 04/03/20 1330  INR 1.4*   Urine analysis:    Component Value Date/Time   COLORURINE YELLOW 04/03/2020 1744   APPEARANCEUR CLEAR 04/03/2020 1744   LABSPEC >1.046 (H) 04/03/2020 1744   PHURINE 5.0 04/03/2020 1744   GLUCOSEU NEGATIVE 04/03/2020 1744   HGBUR NEGATIVE 04/03/2020 1744   BILIRUBINUR NEGATIVE 04/03/2020 1744   KETONESUR NEGATIVE 04/03/2020 1744   PROTEINUR NEGATIVE 04/03/2020 1744   NITRITE NEGATIVE 04/03/2020 1744   LEUKOCYTESUR NEGATIVE 04/03/2020 1744   Kaysen Deal N Jarielys Girardot D.O. [facility]  If 7PM-7AM, please contact overnight-coverage provider If  7AM-7PM, please contact day coverage provider [email or web address]  04/04/2020, 9:49 PM

## 2020-04-04 NOTE — Consult Note (Addendum)
Jonathon Bellows , MD [address] [place], [place], [place], [address] [address], [place], [place], [place], [address] Phone: [phone]  Fax: [phone]  Consultation  Referring Provider:    Dr Tamala Julian  Primary Care Physician:  [facility], [facility] Personal Primary Gastroenterologist:  None          Reason for Consultation:     Abnormal LFTs  Date of Admission:  04/04/2020 Date of Consultation:  04/04/2020         HPI:   Kailen Name is a 40 y.o. female initially presented to the emergency room yesterday with abnormal labs.  She has a history of rheumatic fever with mitral valve replacement, chronic anemia and on Coumadin with anticoagulation.  Status post cholecystectomy. She states that she has been having epigastric pain for the past 2 to 3 weeks.  Describes it as a spasm worse after eating.  Very similar in nature to what she had before she had her gallbladder taken out but more intense in nature.  She had a mild fever and hence was seen by her doctor  at [place],, diagnosed with UTI and commenced on Bactrim.  Subsequently the fevers got higher, her urine which was lighter in color got dark and she developed began to itch all over her body.  Gradually the urine has gotten lighter, she has not had any more itching for many days.  And the last episode of high fever was a few days back.  Denies any other medications.  Presently still has a bit of epigastric discomfort.   Increased alcohol intake over the holidays.  The patient was seen at [facility] and plan was for an MRCP yesterday which they did not have at the hospital and had recommended transfer to [facility].  Patient refused transfer and left.  She returned back to [facility] today.  Had an MRCP earlier today that showed mild prominence of common bile duct with some dilated ductules to the periphery with concern for cholangitis/cholangiopathy no filling  defects within the biliary tree.  Irregularity of the pancreatic duct mild interstitial edematous pancreatitis.  Signs of iron deposition in the liver.  Labs include a white cell count of 6.1 and a hemoglobin of 13.1 g.  LFTs demonstrate an AST of 171 yesterday which has dropped to 136.  ALT was 380 which has reduced to 297 and alkaline phosphatase which is 459 has come down to 386.  Lipase is elevated 166 yesterday but today is 137.  INR 1.4    Past Medical History:  Diagnosis Date  . Abnormal Pap smear of cervix   . Asthma   . Dyspnea   . Heart disease    cardial murmur  . Hyperlipidemia   . Hypertension   . Mitral regurgitation   . Trichomoniasis     Past Surgical History:  Procedure Laterality Date  . MITRAL VALVE REPLACEMENT  11/20/13    Prior to Admission medications   Medication Sig Start Date End Date Taking? Authorizing Provider  albuterol (PROVENTIL HFA;VENTOLIN HFA) 108 (90 Base) MCG/ACT inhaler Inhale 2 puffs into the lungs every 4 (four) hours as needed for wheezing or shortness of breath.    [provider]  albuterol (PROVENTIL) (2.5 MG/3ML) 0.083% nebulizer solution Take 2.5 mg by nebulization 3 (three) times daily.    [provider]  amLODipine (NORVASC) 10 MG tablet Take 10 mg by mouth daily.  01/19/17   [provider]  aspirin 81 MG chewable tablet Chew 81 mg by mouth daily.    [provider]  atorvastatin (LIPITOR) 20 MG tablet Take 20 mg by mouth daily. 05/01/19   [provider]  carvedilol (COREG) 3.125 MG tablet Take 3.125 mg by mouth 2 (two) times daily. 05/01/19   [provider]  cephALEXin (KEFLEX) 500 MG capsule Take 1 capsule (500 mg total) by mouth 4 (four) times daily. Patient not taking: No sig reported 05/13/19   Milton Ferguson, MD  cyclobenzaprine (FLEXERIL) 10 MG tablet Take 10 mg by mouth 3 (three) times daily as needed for muscle spasms.    [provider]  diazepam (VALIUM) 5 MG tablet  Take 5 mg by mouth every 12 (twelve) hours as needed. 11/02/19   [provider]  ferrous sulfate 324 (65 Fe) MG TBEC Take 1 tablet by mouth daily.    [provider]  FLUTICASONE PROPIONATE, INHAL, IN Inhale into the lungs daily.    [provider]  gabapentin (NEURONTIN) 300 MG capsule Take 600 mg by mouth 2 (two) times daily.     [provider]  ondansetron (ZOFRAN ODT) 4 MG disintegrating tablet Take 1 tablet (4 mg total) by mouth every 8 (eight) hours as needed for nausea or vomiting. 04/03/20   Evalee Jefferson, PA-C  pantoprazole (PROTONIX) 40 MG tablet Take 40 mg by mouth daily.    [provider]  promethazine (PHENERGAN) 25 MG suppository Place 1 suppository (25 mg total) rectally every 6 (six) hours as needed for nausea or vomiting. Patient not taking: No sig reported 05/13/19   Milton Ferguson, MD  sulfamethoxazole-trimethoprim (BACTRIM DS) 800-160 MG tablet Take 1 tablet by mouth 2 (two) times daily. Patient not taking: No sig reported 03/26/20   [provider]  SUMAtriptan (IMITREX) 100 MG tablet Take 100 mg by mouth every 2 (two) hours as needed for migraine. May repeat in 2 hours if headache persists or recurs.    [provider]  warfarin (COUMADIN) 4 MG tablet Take 4 mg by mouth. Takes 2 tabs once a day on Thursday, Friday, Saturday, Sunday.    [provider]  warfarin (COUMADIN) 5 MG tablet Take 5 mg by mouth daily. Take 1 tablet daily on Monday, Tuesday, and Wednesday.    [provider]    Family History  Problem Relation Age of Onset  . Diabetes Mother   . Heart disease Father   . Heart attack Father   . Hypertension Other   . Stroke Other      Social History   Tobacco Use  . Smoking status: Current Every Day Smoker    Packs/day: 0.25    Years: 15.00    Pack years: 3.75    Types: Cigarettes  . Smokeless tobacco: Never Used  Substance Use Topics  . Alcohol use: No  . Drug use: Yes     Types: Marijuana    Comment: occasional    Allergies as of 04/04/2020 - Review Complete 04/04/2020  Allergen Reaction Noted  . Tramadol Hives 11/26/2013    Review of Systems:    All systems reviewed and negative except where noted in HPI.   Physical Exam:  Vital signs in last 24 hours: Temp:  [98.8 F (37.1 C)] 98.8 F (37.1 C) (01/14 0940) Pulse Rate:  [85-97] 97 (01/14 0940) Resp:  [16-18] 16 (01/14 0940) BP: (127-132)/(76-89) 127/76 (01/14 0940) SpO2:  [99 %-100 %] 99 % (01/14 0940) Weight:  [81.6  kg] 81.6 kg (01/14 0942)   General:   Pleasant, cooperative in NAD Head:  Normocephalic and atraumatic. Eyes:   No icterus.   Conjunctiva pink. PERRLA. Ears:  Normal auditory acuity. Neck:  Supple; no masses or thyroidomegaly Lungs: Respirations even and unlabored. Lungs clear to auscultation bilaterally.   No wheezes, crackles, or rhonchi.  Heart:  Regular rate and rhythm;  Without murmur, clicks, rubs or gallops Abdomen:  Soft, nondistended, nontender. Normal bowel sounds. No appreciable masses or hepatomegaly.  No rebound or guarding.  Neurologic:  Alert and oriented x3;  grossly normal neurologically. Skin:  Intact without significant lesions or rashes. Cervical Nodes:  No significant cervical adenopathy. Psych:  Alert and cooperative. Normal affect.  LAB RESULTS: Recent Labs    04/03/20 1330 04/04/20 0945  WBC 6.1 6.1  HGB 14.5 13.1  HCT 43.7 39.5  PLT 338 326   BMET Recent Labs    04/03/20 1330 04/04/20 0945  NA 136 137  K 4.6 4.1  CL 101 104  CO2 28 26  GLUCOSE 127* 155*  BUN 15 12  CREATININE 0.85 0.71  CALCIUM 10.1 9.5   LFT Recent Labs    04/04/20 0945  PROT 7.7  ALBUMIN 4.1  AST 136*  ALT 297*  ALKPHOS 386*  BILITOT 0.8   PT/INR Recent Labs    04/03/20 1330  LABPROT 17.0*  INR 1.4*    STUDIES: CT ABDOMEN PELVIS W CONTRAST  Result Date: 04/03/2020 CLINICAL DATA:  Abdominal pain and fever. Constipation. Elevated liver enzymes.  EXAM: CT ABDOMEN AND PELVIS WITH CONTRAST TECHNIQUE: Multidetector CT imaging of the abdomen and pelvis was performed using the standard protocol following bolus administration of intravenous contrast. CONTRAST:  112mL OMNIPAQUE IOHEXOL 300 MG/ML  SOLN COMPARISON:  05/13/2019 FINDINGS: Lower chest: Normal Hepatobiliary: Previous cholecystectomy. Liver parenchyma is normal. Pancreas: Normal Spleen: Normal Adrenals/Urinary Tract: Adrenal glands are normal. Right kidney is normal. Chronic focal volume loss in the upper pole of the left kidney. No hydronephrosis. No stone disease. Bladder is normal. Stomach/Bowel: Stomach and small intestine are normal. The appendix is normal. Amount of stool in the colon is within normal limits. No diverticulosis or diverticulitis. Vascular/Lymphatic: Aortic atherosclerosis. No aneurysm. IVC is normal. No retroperitoneal adenopathy. Reproductive: Multiple small leiomyomas of the uterus. No adnexal lesion. Other: No free fluid or air.  No hernia. Musculoskeletal: Chronic degenerative change at L5-S1 which could be a cause of low back pain. IMPRESSION: 1. No acute finding to explain the clinical presentation. No evidence of bowel obstruction or inflammation. Amount of stool in the colon is within normal limits. 2. Previous cholecystectomy. 3. Chronic focal volume loss in the upper pole of the left kidney. 4. Aortic atherosclerosis. 5. Multiple small leiomyomas of the uterus. 6. Chronic degenerative change at L5-S1 which could be a cause of low back pain. Aortic Atherosclerosis (ICD10-I70.0). Electronically Signed   By: Nelson Chimes M.D.   On: 04/03/2020 16:43   MR 3D Recon At Scanner  Result Date: 04/04/2020 CLINICAL DATA:  Abdominal pain, suspected biliary obstruction in this 40 year old female. EXAM: MRI ABDOMEN WITHOUT AND WITH CONTRAST (INCLUDING MRCP) TECHNIQUE: Multiplanar multisequence MR imaging of the abdomen was performed both before and after the administration of  intravenous contrast. Heavily T2-weighted images of the biliary and pancreatic ducts were obtained, and three-dimensional MRCP images were rendered by post processing. CONTRAST:  7.17mL GADAVIST GADOBUTROL 1 MMOL/ML IV SOLN COMPARISON:  CT evaluation of April 03, 2020 FINDINGS: Lower chest: Incidental imaging of the  lung bases is unremarkable with limited assessment on MRI. Hepatobiliary: Mild iron deposition in the liver without focal lesion. Post cholecystectomy. Mild prominence of common bile duct compatible with post cholecystectomy changes. No filling defect in the biliary tree. Postcontrast images with question of diffuse mild enhancement of the biliary tree best seen on coronal images, image 41 of series 24. Mild irregularity of the biliary tree is suggested on the high-resolution MRCP sequences and ducts are seen to the periphery beyond what is expected for simple post cholecystectomy changes. Pancreas: Mild increased T2 signal about the pancreas and diminished intrinsic T1 signal. No peripancreatic stranding or fluid collection. Normal enhancement of the gland. Spleen:  Normal Adrenals/Urinary Tract:  Adrenal glands are normal. Symmetric renal enhancement with small renal cyst in the upper pole the LEFT kidney. Mild chronic scarring of the LEFT kidney upper pole. Stomach/Bowel: No acute gastrointestinal process to the extent evaluated. Pelvic bowel loops not imaged. Study not performed for bowel evaluation. Vascular/Lymphatic: Patent abdominal vessels. There is no gastrohepatic or hepatoduodenal ligament lymphadenopathy. No retroperitoneal or mesenteric lymphadenopathy. Other: Signs of mitral valve replacement. Susceptibility artifact about the heart related to this procedure. Musculoskeletal: Mild signal loss related to the marrow spaces of the spine relative to expected signal within the spine as compared to muscle on in phase T1 weighted gradient echo sequences. Low signal on T2 as well. IMPRESSION: 1.  Mild prominence of common bile duct could be seen with post cholecystectomy changes. However, there are signs of peribiliary enhancement and dilated ductules to the periphery with some irregularity that raises the question of cholangitis/cholangiopathy. Correlate with clinical symptoms. This appearance could be seen in the setting of [place] as well but is nonspecific particularly in the acute setting. Would consider GI follow-up with follow-up imaging as warranted. 2. No filling defect within the biliary tree. Additional consideration for above findings would include recent stone passage. 3. Mild irregularity of the pancreatic duct may be related to pancreatitis and or prior episodes of pancreatitis. Current findings of mild interstitial edematous pancreatitis. 4. Given constellation of above findings would also include the possibility of IgG 4 related disease/autoimmune pancreatitis there prominence of the pancreatic duct would be atypical for this entity. 5. Signs of iron deposition, mild in the liver with evidence of iron deposition in marrow spaces of the spine as well. Correlate with any history of anemia and longstanding iron replacement. Electronically Signed   By: Zetta Bills M.D.   On: 04/04/2020 13:08   MR ABDOMEN MRCP W WO CONTAST  Result Date: 04/04/2020 CLINICAL DATA:  Abdominal pain, suspected biliary obstruction in this 40 year old female. EXAM: MRI ABDOMEN WITHOUT AND WITH CONTRAST (INCLUDING MRCP) TECHNIQUE: Multiplanar multisequence MR imaging of the abdomen was performed both before and after the administration of intravenous contrast. Heavily T2-weighted images of the biliary and pancreatic ducts were obtained, and three-dimensional MRCP images were rendered by post processing. CONTRAST:  7.70mL GADAVIST GADOBUTROL 1 MMOL/ML IV SOLN COMPARISON:  CT evaluation of April 03, 2020 FINDINGS: Lower chest: Incidental imaging of the lung bases is unremarkable with limited assessment on MRI.  Hepatobiliary: Mild iron deposition in the liver without focal lesion. Post cholecystectomy. Mild prominence of common bile duct compatible with post cholecystectomy changes. No filling defect in the biliary tree. Postcontrast images with question of diffuse mild enhancement of the biliary tree best seen on coronal images, image 41 of series 24. Mild irregularity of the biliary tree is suggested on the high-resolution MRCP sequences and ducts are seen  to the periphery beyond what is expected for simple post cholecystectomy changes. Pancreas: Mild increased T2 signal about the pancreas and diminished intrinsic T1 signal. No peripancreatic stranding or fluid collection. Normal enhancement of the gland. Spleen:  Normal Adrenals/Urinary Tract:  Adrenal glands are normal. Symmetric renal enhancement with small renal cyst in the upper pole the LEFT kidney. Mild chronic scarring of the LEFT kidney upper pole. Stomach/Bowel: No acute gastrointestinal process to the extent evaluated. Pelvic bowel loops not imaged. Study not performed for bowel evaluation. Vascular/Lymphatic: Patent abdominal vessels. There is no gastrohepatic or hepatoduodenal ligament lymphadenopathy. No retroperitoneal or mesenteric lymphadenopathy. Other: Signs of mitral valve replacement. Susceptibility artifact about the heart related to this procedure. Musculoskeletal: Mild signal loss related to the marrow spaces of the spine relative to expected signal within the spine as compared to muscle on in phase T1 weighted gradient echo sequences. Low signal on T2 as well. IMPRESSION: 1. Mild prominence of common bile duct could be seen with post cholecystectomy changes. However, there are signs of peribiliary enhancement and dilated ductules to the periphery with some irregularity that raises the question of cholangitis/cholangiopathy. Correlate with clinical symptoms. This appearance could be seen in the setting of [place] as well but is nonspecific  particularly in the acute setting. Would consider GI follow-up with follow-up imaging as warranted. 2. No filling defect within the biliary tree. Additional consideration for above findings would include recent stone passage. 3. Mild irregularity of the pancreatic duct may be related to pancreatitis and or prior episodes of pancreatitis. Current findings of mild interstitial edematous pancreatitis. 4. Given constellation of above findings would also include the possibility of IgG 4 related disease/autoimmune pancreatitis there prominence of the pancreatic duct would be atypical for this entity. 5. Signs of iron deposition, mild in the liver with evidence of iron deposition in marrow spaces of the spine as well. Correlate with any history of anemia and longstanding iron replacement. Electronically Signed   By: Zetta Bills M.D.   On: 04/04/2020 13:08      Impression / Plan:   Persais Ethridge is a 40 y.o. y/o female with a history of mitral valve replacement for rheumatic fever to the hospital with a history of abdominal pain abnormal LFTs.  Presented to [facility] yesterday left AMA and is back to our hospital today MRCP does not show any biliary filling defects but shows primarily prominence of bile ducts questionable PSC, mild interstitial edema of the pancreas concerning for pancreatitis with elevated lipase and abdominal pain.  LFTs are trending down.  Total bilirubin was never elevated.  Recently treated for a UTI with Bactrim.  After starting antibiotics he had a higher fever, developed itching all over her body, developed dark urine.  This can be seen in DRESS syndrome, also likely she had a stone in the common bile duct which she has likely passed and this may have caused a  degree of pancreatitis/mild cholangitis.     To evaluate fever with a history of mitral valve replacement would suggest full infection work-up and antibiotics as felt appropriate considering the chest x-ray.  This is  due to passage of a gallstone would expect the LFTs to further trend down over the next 24 hours.  Complete work-up as there is a concern for PSC on the MRCP I will order autoimmune work-up, acute viral hepatitis work-up.  For the pancreatitis suggest IV fluids with Ringer lactate, oral intake as tolerated, I will check triglycerides and IgG4 levels.  Also advised to limit alcohol intake in the future.  Thank you for involving me in the care of this patient.      LOS: 0 days   Jonathon Bellows, MD  04/04/2020, 1:58 PM

## 2020-04-04 NOTE — ED Notes (Signed)
Pt demanding food. Pt stated "the GI doctor told me I could have food and no one has given me anything yet. If you don't give me food in the next minute I'm going to the cafe myself". This RN went to other side of ER to obtain basic food tray and drink for pt. Given to pt as GI doc verbalized.

## 2020-04-04 NOTE — ED Triage Notes (Signed)
Pt to ED via POV stating that she is having LLQ abdominal pain. Pt seen at [facility] yesterday and left AMA. Per paperwork it was suspected that she had a blockage in her bile duct. Pt was advised that she needed to go to [facility] ED for MRI that they were not able to do at [facility]. Pt states that she got fluids yesterday and after the fluids she felt much better. Pt states that she is still having pain. Pt denies N/V/D currently. Pt is in NAD.

## 2020-04-04 NOTE — ED Provider Notes (Signed)
I assumed care of this patient from Dr. Corky Downs at approximately 12:30 PM.  In brief patient presents for further evaluation of symptoms right upper quadrant pain.  She was initially seen at outside hospital with recommendation for transfer to medical [facility] that she could undergo MRCP but elected to present to [facility] for MRCP.  Of note patient also has been on Bactrim for possible UTI had developed some itchiness and redness in her arms.  Labs obtained today remarkable for mild hyperglycemia with a glucose of 135, AST of 136 which is down trended from yesterday at 171 as well as an ALT of 277 compared to 380 yesterday and alk phos of 386 compared to 459 yesterday.  CBC is unremarkable.  Lipase is 137.  No significant history of regular NSAID or Tylenol use.  Patient states she thinks she had 1-2 bottles of wine over the holidays but is not otherwise a regular heavy drinker.  Plan is to follow-up MRCP and likely discussed with GI.  Per GI after they came to the emergency room to evaluate patient they think she may have passed a gallstone.  Given she did report possible fever recommend obtaining blood cultures and admitting for observation hospital service.  Blood cultures ordered and patient will be admitted to hospital service for further evaluation and management.  Marland Kitchen1-3 Lead EKG Interpretation Performed by: Lucrezia Starch, MD Authorized by: Lucrezia Starch, MD     Interpretation: normal     ECG rate assessment: normal     Rhythm: sinus rhythm     Ectopy: none     Conduction: normal        Lucrezia Starch, MD 04/04/20 1505

## 2020-04-04 NOTE — ED Notes (Addendum)
5 tiger top tubes and 2 tall lav obtained per call made to lab. Pt hard stick. 1 set of blood cultures obtained from R upper arm IV placed by this RN.

## 2020-04-04 NOTE — ED Notes (Signed)
Upon arrival to Patient's room, this Tech introduced herself, asked the patient to verify her name and date of birth, after a long pause, she blew out her breath and said "I don't know how many more times y'all gone ask me that". Patient refused to be interviewed by pharmacy tech at this time. She stated "EVERYTHING YOU HAVE IN THE COMPUTER IS RIGHT, I WAS JUST AT [facility] YESTERDAY AND THEY ASKED ME ABOUT THE SAME THING". When questioned about her Warfarin dose, she stated, "some days I take 8 mg and other days I take 5 mg". When asked which days, she would not elaborate. Patient said it's been 2 days since she took Warfarin because she was NPO yesterday and today.

## 2020-04-04 NOTE — ED Notes (Signed)
Pt reports is a hard stick. Phlebotomy reports should be to bedside soon.

## 2020-04-05 DIAGNOSIS — R1011 Right upper quadrant pain: Secondary | ICD-10-CM

## 2020-04-05 DIAGNOSIS — Z952 Presence of prosthetic heart valve: Secondary | ICD-10-CM | POA: Diagnosis not present

## 2020-04-05 DIAGNOSIS — R7401 Elevation of levels of liver transaminase levels: Secondary | ICD-10-CM

## 2020-04-05 LAB — CBC
HCT: 38.7 % (ref 36.0–46.0)
Hemoglobin: 13.3 g/dL (ref 12.0–15.0)
MCH: 32.1 pg (ref 26.0–34.0)
MCHC: 34.4 g/dL (ref 30.0–36.0)
MCV: 93.5 fL (ref 80.0–100.0)
Platelets: 307 10*3/uL (ref 150–400)
RBC: 4.14 MIL/uL (ref 3.87–5.11)
RDW: 13.9 % (ref 11.5–15.5)
WBC: 5.3 10*3/uL (ref 4.0–10.5)
nRBC: 0 % (ref 0.0–0.2)

## 2020-04-05 LAB — COMPREHENSIVE METABOLIC PANEL
ALT: 238 U/L — ABNORMAL HIGH (ref 0–44)
AST: 123 U/L — ABNORMAL HIGH (ref 15–41)
Albumin: 3.4 g/dL — ABNORMAL LOW (ref 3.5–5.0)
Alkaline Phosphatase: 338 U/L — ABNORMAL HIGH (ref 38–126)
Anion gap: 9 (ref 5–15)
BUN: 10 mg/dL (ref 6–20)
CO2: 24 mmol/L (ref 22–32)
Calcium: 9.2 mg/dL (ref 8.9–10.3)
Chloride: 104 mmol/L (ref 98–111)
Creatinine, Ser: 0.67 mg/dL (ref 0.44–1.00)
GFR, Estimated: >60 mL/min (ref >60)
Glucose, Bld: 121 mg/dL — ABNORMAL HIGH (ref 70–99)
Potassium: 3.9 mmol/L (ref 3.5–5.1)
Sodium: 137 mmol/L (ref 135–145)
Total Bilirubin: 0.6 mg/dL (ref 0.3–1.2)
Total Protein: 6.6 g/dL (ref 6.5–8.1)

## 2020-04-05 LAB — HEPARIN LEVEL (UNFRACTIONATED)
Heparin Unfractionated: 0.6 IU/mL (ref 0.30–0.70)
Heparin Unfractionated: 0.81 IU/mL — ABNORMAL HIGH (ref 0.30–0.70)

## 2020-04-05 LAB — PROTIME-INR
INR: 1.4 — ABNORMAL HIGH (ref 0.8–1.2)
Prothrombin Time: 16.7 seconds — ABNORMAL HIGH (ref 11.4–15.2)

## 2020-04-05 LAB — APTT: aPTT: 156 seconds — ABNORMAL HIGH (ref 24–36)

## 2020-04-05 LAB — SARS CORONAVIRUS 2 (TAT 6-24 HRS): SARS Coronavirus 2: NEGATIVE

## 2020-04-05 MED ORDER — WARFARIN SODIUM 10 MG PO TABS
10.0000 mg | ORAL_TABLET | Freq: Once | ORAL | Status: DC
  Filled 2020-04-05: qty 1

## 2020-04-05 NOTE — Consult Note (Signed)
[facility] for Warfarin with IV Heparin bridge until therapeutic Indication: Mechanical mitral valve   Heparin dosing weight: 78.4 kg  Labs: Recent Labs    04/03/20 1330 04/04/20 0945 04/05/20 0119  HGB 14.5 13.1  --   HCT 43.7 39.5  --   PLT 338 326  --   LABPROT 17.0*  --   --   INR 1.4*  --   --   HEPARINUNFRC  --   --  0.60  CREATININE 0.85 0.71  --     Estimated Creatinine Clearance: 101.7 mL/min (by C-G formula based on SCr of 0.71 mg/dL).   Medical History: Past Medical History:  Diagnosis Date  . Abnormal Pap smear of cervix   . Asthma   . Dyspnea   . Heart disease    cardial murmur  . Hyperlipidemia   . Hypertension   . Mitral regurgitation   . Trichomoniasis    Medications:  Warfarin 5 mg SuTh, 6.5 mg TuSa, 8 mg MWF (see cardiology office telephone encounter 03/12/20)  Medication reconciliation technician was unable to verify dosing regimen with patient. Patient reports taking 8 mg and 5 mg doses on varying days.   Patient only appears to fill warfarin 5 mg and warfarin 4 mg tablets. Unclear how patient would take a 6.5 mg dose. Attempted to call cardiology office but they were closed. Will need to follow-up next week.  Assessment: Patient is a 40 y/o F with medical history as above and including rheumatic fever status post mechanical mitral valve replacement on warfarin who presented to the ED 1/14 with LLQ abdominal pain. Patient was apparently taking Bactrim recently for a UTI and has completed the prescription. Pharmacy consulted to manage warfarin.  Albumin: 4.1  Date INR Plan  1/14 1.4 Warfarin 10 mg   Goal of Therapy:  INR 2.5 - 3.5   Plan:  Warfarin --INR is subtherapeutic. Will given warfarin 10 mg x 1 dose tonight. Patient reports she has missed several doses as she was NPO --Daily INR per protocol  Heparin --4000 unit IV heparin bolus x 1 followed by continuous infusion at 1200 units/hr --Will  check HL 6 hours after initiation of infusion --Daily CBC per protocol while on heparin infusion --Bridge until therapeutic on warfarin   0115 0119 HL 0.60, therapeutic.  Will continue Heparin at current rate and recheck HL in 6 hrs to confirm.  Monitor CBC  Hart Robinsons A 04/05/2020,2:08 AM

## 2020-04-05 NOTE — Discharge Summary (Addendum)
Physician Discharge Summary  Anne Kirby CVE:[id number] DOB: Jul 19, 1980 DOA: 04/04/2020  PCP: [facility]  Admit date: 04/04/2020 Discharge date: 04/05/2020  Admitted From: home Disposition:  Pt left AMA    Home Health: no  Equipment/Devices  Discharge Condition: guarded CODE STATUS: full  Diet recommendation: Heart Healthy  Brief/Interim Summary: HPI was taken from Dr. Tobie Poet: Anne Kirby is a 40 y.o. 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  She reports the abdominal pain that started about two weeks. The right back/ribs pain wraps to the RUQ. She describes the pain as a burning pain, 10/10 and now it is a 4 after pain medication. She states the pain is worst with eating and is persistent. She reports the pain last minutes if she is on an empty stomach. She states she can tolerated the boost.   She endorses associated nausea and vomiting.  She states she has had anorexia for two days. She states the blue emesis bag is almost full yesterday and [name].   Social history: She lives at home with her 2 children. She works at [employer]. She smokes 2 cigarettes/day, denies EtOH, and endorses infrequent marijuana smoking.  ROS: Constitutional: no weight change, no fever ENT/Mouth: no sore throat, no rhinorrhea Eyes: no eye pain, no vision changes Cardiovascular: no chest pain, + dyspnea, no edema, no palpitations Respiratory: no cough, no sputum, no wheezing Gastrointestinal: + nausea, + vomiting, no diarrhea, no constipation Genitourinary: no urinary incontinence, no dysuria, no hematuria Musculoskeletal: no arthralgias, no myalgias Skin: no skin lesions, no pruritus, Neuro:+weakness, no loss of  consciousness, no syncope Psych: no anxiety, no depression,+decrease appetite Heme/Lymph: no bruising, no bleeding  ED Course: Discussed with ED provider, patient requiring hospitalization for abdominal pain concerning for PSC versus infectious etiology.  GI recommended admit patient for infectious work-up.  Vital signs in the ED were reassuring.  Labs were unremarkable except for hyperglycemia, elevated alk phos, elevated AST and ALT which have improved since CMP at [facility], on 04/03/2020.   Hospital Course from Dr. Jimmye Norman 04/05/20: Pt presented w/ abd pain of unknown etiology, possible PSC. Pt was started on IV abxs on admission. Autoimmune was done and pending at the time pt left AMA. Pt decided to leave AMA despite myself advising against it due to below stated treatment/work-up & risk of fatal complications and pt verbalized her understanding. Pt left AMA.   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 Continue on IV metronidazole, cefazolin. Blood cxs are pending.   Transaminitis: likely secondary to passage of a gallstone as per GI. Still elevated but trending down   HTN: continue on home dose of coreg, amlodipine.   HLD: continue on statin   Mitral valve repair: continue warfarin. Hx of rheumatic fever as child. Pharmacy to dose and monitor INR   Discharge Instructions     Allergies  Allergen Reactions  . Bactrim [Sulfamethoxazole-Trimethoprim] Rash  . Tramadol Hives  . Latex Rash    Consultations:  GI, Dr. Vicente Males    Procedures/Studies: CT ABDOMEN PELVIS W CONTRAST  Result Date:  04/03/2020 CLINICAL DATA:  Abdominal pain and fever. Constipation. Elevated liver enzymes. EXAM: CT  ABDOMEN AND PELVIS WITH CONTRAST TECHNIQUE: Multidetector CT imaging of the abdomen and pelvis was performed using the standard protocol following bolus administration of intravenous contrast. CONTRAST:  143m OMNIPAQUE IOHEXOL 300 MG/ML  SOLN COMPARISON:  05/13/2019 FINDINGS: Lower chest: Normal Hepatobiliary: Previous cholecystectomy. Liver parenchyma is normal. Pancreas: Normal Spleen: Normal Adrenals/Urinary Tract: Adrenal glands are normal. Right kidney is normal. Chronic focal volume loss in the upper pole of the left kidney. No hydronephrosis. No stone disease. Bladder is normal. Stomach/Bowel: Stomach and small intestine are normal. The appendix is normal. Amount of stool in the colon is within normal limits. No diverticulosis or diverticulitis. Vascular/Lymphatic: Aortic atherosclerosis. No aneurysm. IVC is normal. No retroperitoneal adenopathy. Reproductive: Multiple small leiomyomas of the uterus. No adnexal lesion. Other: No free fluid or air.  No hernia. Musculoskeletal: Chronic degenerative change at L5-S1 which could be a cause of low back pain. IMPRESSION: 1. No acute finding to explain the clinical presentation. No evidence of bowel obstruction or inflammation. Amount of stool in the colon is within normal limits. 2. Previous cholecystectomy. 3. Chronic focal volume loss in the upper pole of the left kidney. 4. Aortic atherosclerosis. 5. Multiple small leiomyomas of the uterus. 6. Chronic degenerative change at L5-S1 which could be a cause of low back pain. Aortic Atherosclerosis (ICD10-I70.0). Electronically Signed   By: MNelson ChimesM.D.   On: 04/03/2020 16:43   MR 3D Recon At Scanner  Result Date: 04/04/2020 CLINICAL DATA:  Abdominal pain, suspected biliary obstruction in this 40year old female. EXAM: MRI ABDOMEN WITHOUT AND WITH CONTRAST (INCLUDING MRCP) TECHNIQUE: Multiplanar multisequence MR imaging of the abdomen was performed both before and after the administration of intravenous  contrast. Heavily T2-weighted images of the biliary and pancreatic ducts were obtained, and three-dimensional MRCP images were rendered by post processing. CONTRAST:  7.568mGADAVIST GADOBUTROL 1 MMOL/ML IV SOLN COMPARISON:  CT evaluation of April 03, 2020 FINDINGS: Lower chest: Incidental imaging of the lung bases is unremarkable with limited assessment on MRI. Hepatobiliary: Mild iron deposition in the liver without focal lesion. Post cholecystectomy. Mild prominence of common bile duct compatible with post cholecystectomy changes. No filling defect in the biliary tree. Postcontrast images with question of diffuse mild enhancement of the biliary tree best seen on coronal images, image 41 of series 24. Mild irregularity of the biliary tree is suggested on the high-resolution MRCP sequences and ducts are seen to the periphery beyond what is expected for simple post cholecystectomy changes. Pancreas: Mild increased T2 signal about the pancreas and diminished intrinsic T1 signal. No peripancreatic stranding or fluid collection. Normal enhancement of the gland. Spleen:  Normal Adrenals/Urinary Tract:  Adrenal glands are normal. Symmetric renal enhancement with small renal cyst in the upper pole the LEFT kidney. Mild chronic scarring of the LEFT kidney upper pole. Stomach/Bowel: No acute gastrointestinal process to the extent evaluated. Pelvic bowel loops not imaged. Study not performed for bowel evaluation. Vascular/Lymphatic: Patent abdominal vessels. There is no gastrohepatic or hepatoduodenal ligament lymphadenopathy. No retroperitoneal or mesenteric lymphadenopathy. Other: Signs of mitral valve replacement. Susceptibility artifact about the heart related to this procedure. Musculoskeletal: Mild signal loss related to the marrow spaces of the spine relative to expected signal within the spine as compared to muscle on in phase T1 weighted gradient echo sequences. Low signal on T2 as well. IMPRESSION: 1. Mild  prominence of common bile duct could be seen with post cholecystectomy  changes. However, there are signs of peribiliary enhancement and dilated ductules to the periphery with some irregularity that raises the question of cholangitis/cholangiopathy. Correlate with clinical symptoms. This appearance could be seen in the setting of [place] as well but is nonspecific particularly in the acute setting. Would consider GI follow-up with follow-up imaging as warranted. 2. No filling defect within the biliary tree. Additional consideration for above findings would include recent stone passage. 3. Mild irregularity of the pancreatic duct may be related to pancreatitis and or prior episodes of pancreatitis. Current findings of mild interstitial edematous pancreatitis. 4. Given constellation of above findings would also include the possibility of IgG 4 related disease/autoimmune pancreatitis there prominence of the pancreatic duct would be atypical for this entity. 5. Signs of iron deposition, mild in the liver with evidence of iron deposition in marrow spaces of the spine as well. Correlate with any history of anemia and longstanding iron replacement. Electronically Signed   By: Zetta Bills M.D.   On: 04/04/2020 13:08   MR ABDOMEN MRCP W WO CONTAST  Result Date: 04/04/2020 CLINICAL DATA:  Abdominal pain, suspected biliary obstruction in this 40 year old female. EXAM: MRI ABDOMEN WITHOUT AND WITH CONTRAST (INCLUDING MRCP) TECHNIQUE: Multiplanar multisequence MR imaging of the abdomen was performed both before and after the administration of intravenous contrast. Heavily T2-weighted images of the biliary and pancreatic ducts were obtained, and three-dimensional MRCP images were rendered by post processing. CONTRAST:  7.70m GADAVIST GADOBUTROL 1 MMOL/ML IV SOLN COMPARISON:  CT evaluation of April 03, 2020 FINDINGS: Lower chest: Incidental imaging of the lung bases is unremarkable with limited assessment on MRI.  Hepatobiliary: Mild iron deposition in the liver without focal lesion. Post cholecystectomy. Mild prominence of common bile duct compatible with post cholecystectomy changes. No filling defect in the biliary tree. Postcontrast images with question of diffuse mild enhancement of the biliary tree best seen on coronal images, image 41 of series 24. Mild irregularity of the biliary tree is suggested on the high-resolution MRCP sequences and ducts are seen to the periphery beyond what is expected for simple post cholecystectomy changes. Pancreas: Mild increased T2 signal about the pancreas and diminished intrinsic T1 signal. No peripancreatic stranding or fluid collection. Normal enhancement of the gland. Spleen:  Normal Adrenals/Urinary Tract:  Adrenal glands are normal. Symmetric renal enhancement with small renal cyst in the upper pole the LEFT kidney. Mild chronic scarring of the LEFT kidney upper pole. Stomach/Bowel: No acute gastrointestinal process to the extent evaluated. Pelvic bowel loops not imaged. Study not performed for bowel evaluation. Vascular/Lymphatic: Patent abdominal vessels. There is no gastrohepatic or hepatoduodenal ligament lymphadenopathy. No retroperitoneal or mesenteric lymphadenopathy. Other: Signs of mitral valve replacement. Susceptibility artifact about the heart related to this procedure. Musculoskeletal: Mild signal loss related to the marrow spaces of the spine relative to expected signal within the spine as compared to muscle on in phase T1 weighted gradient echo sequences. Low signal on T2 as well. IMPRESSION: 1. Mild prominence of common bile duct could be seen with post cholecystectomy changes. However, there are signs of peribiliary enhancement and dilated ductules to the periphery with some irregularity that raises the question of cholangitis/cholangiopathy. Correlate with clinical symptoms. This appearance could be seen in the setting of [place] well but is nonspecific  particularly in the acute setting. Would consider GI follow-up with follow-up imaging as warranted. 2. No filling defect within the biliary tree. Additional consideration for above findings would include recent stone passage. 3. Mild irregularity of  the pancreatic duct may be related to pancreatitis and or prior episodes of pancreatitis. Current findings of mild interstitial edematous pancreatitis. 4. Given constellation of above findings would also include the possibility of IgG 4 related disease/autoimmune pancreatitis there prominence of the pancreatic duct would be atypical for this entity. 5. Signs of iron deposition, mild in the liver with evidence of iron deposition in marrow spaces of the spine as well. Correlate with any history of anemia and longstanding iron replacement. Electronically Signed   By: Zetta Bills M.D.   On: 04/04/2020 13:08     Subjective: Pt stating she feels better and is ready to go home. Pt denies any abd pain.   Discharge Exam: Vitals:   04/05/20 0454 04/05/20 0801  BP: 99/66 127/90  Pulse: 78 78  Resp: 16 20  Temp: 97.8 F (36.6 C) 98.3 F (36.8 C)  SpO2: 97% 100%   Vitals:   04/04/20 2234 04/05/20 0022 04/05/20 0454 04/05/20 0801  BP: 124/86 119/88 99/66 127/90  Pulse: 74 79 78 78  Resp: '19 18 16 20  ' Temp: 98.1 F (36.7 C) 97.8 F (36.6 C) 97.8 F (36.6 C) 98.3 F (36.8 C)  TempSrc: Oral Oral Oral Oral  SpO2: 100% 99% 97% 100%  Weight:      Height:        General: Pt is alert, awake, not in acute distress Cardiovascular: S1/S2 +, no rubs, no gallops Respiratory: CTA bilaterally, no wheezing, no rhonchi Abdominal: Soft, NT, ND, bowel sounds + Extremities:  no cyanosis    The results of significant diagnostics from this hospitalization (including imaging, microbiology, ancillary and laboratory) are listed below for reference.     Microbiology: Recent Results (from the past 240 hour(s))  SARS CORONAVIRUS 2 (TAT 6-24 HRS) Nasopharyngeal  Nasopharyngeal Swab     Status: None   Collection Time: 04/04/20  3:06 PM   Specimen: Nasopharyngeal Swab  Result Value Ref Range Status   SARS Coronavirus 2 NEGATIVE NEGATIVE Final    Comment: (NOTE) SARS-CoV-2 target nucleic acids are NOT DETECTED.  The SARS-CoV-2 RNA is generally detectable in upper and lower respiratory specimens during the acute phase of infection. Negative results do not preclude SARS-CoV-2 infection, do not rule out co-infections with other pathogens, and should not be used as the sole basis for treatment or other patient management decisions. Negative results must be combined with clinical observations, patient history, and epidemiological information. The expected result is Negative.  Fact Sheet for Patients: SugarRoll.be  Fact Sheet for Healthcare Providers: https://www.woods-mathews.com/  This test is not yet approved or cleared by the [place] FDA and  has been authorized for detection and/or diagnosis of SARS-CoV-2 by FDA under an Emergency Use Authorization (EUA). This EUA will remain  in effect (meaning this test can be used) for the duration of the COVID-19 declaration under Se ction 564(b)(1) of the Act, 21 U.S.C. section 360bbb-3(b)(1), unless the authorization is terminated or revoked sooner.  Performed at [facility], [place] [address], [place], [place] [address]   Blood culture (routine x 2)     Status: None (Preliminary result)   Collection Time: 04/04/20  3:36 PM   Specimen: BLOOD  Result Value Ref Range Status   Specimen Description BLOOD BLOOD RIGHT FOREARM  Final   Special Requests   Final    BOTTLES DRAWN AEROBIC AND ANAEROBIC Blood Culture adequate volume   Culture   Final    NO GROWTH < 24 HOURS Performed at [facility], [address] [place], [place] [address]    Report Status PENDING  Incomplete  Blood culture (routine x 2)     Status: None (Preliminary result)    Collection Time: 04/04/20  3:37 PM   Specimen: BLOOD  Result Value Ref Range Status   Specimen Description BLOOD BLOOD RIGHT HAND  Final   Special Requests   Final    BOTTLES DRAWN AEROBIC AND ANAEROBIC Blood Culture adequate volume   Culture   Final    NO GROWTH < 24 HOURS Performed at [facility], [address] [place], [place] [address]    Report Status PENDING  Incomplete     Labs: BNP (last 3 results) No results for input(s): BNP in the last 8760 hours. Basic Metabolic Panel: Recent Labs  Lab 04/03/20 1330 04/04/20 0945 04/05/20 0528  NA 136 137 137  K 4.6 4.1 3.9  CL 101 104 104  CO2 '28 26 24  ' GLUCOSE 127* 155* 121*  BUN '15 12 10  ' CREATININE 0.85 0.71 0.67  CALCIUM 10.1 9.5 9.2   Liver Function Tests: Recent Labs  Lab 04/03/20 1330 04/04/20 0945 04/05/20 0528  AST 171* 136* 123*  ALT 380* 297* 238*  ALKPHOS 459* 386* 338*  BILITOT 0.9 0.8 0.6  PROT 8.6* 7.7 6.6  ALBUMIN 4.4 4.1 3.4*   Recent Labs  Lab 04/03/20 1330 04/04/20 0945  LIPASE 166* 137*   No results for input(s): AMMONIA in the last 168 hours. CBC: Recent Labs  Lab 04/03/20 1330 04/04/20 0945 04/05/20 0748  WBC 6.1 6.1 5.3  NEUTROABS 3.3  --   --   HGB 14.5 13.1 13.3  HCT 43.7 39.5 38.7  MCV 96.3 95.6 93.5  PLT 338 326 307   Cardiac Enzymes: No results for input(s): CKTOTAL, CKMB, CKMBINDEX, TROPONINI in the last 168 hours. BNP: Invalid input(s): POCBNP CBG: No results for input(s): GLUCAP in the last 168 hours. D-Dimer No results for input(s): DDIMER in the last 72 hours. Hgb A1c No results for input(s): HGBA1C in the last 72 hours. Lipid Profile Recent Labs    04/04/20 1532  TRIG 191*   Thyroid function studies No results for input(s): TSH, T4TOTAL, T3FREE, THYROIDAB in the last 72 hours.  Invalid input(s): FREET3 Anemia work up No results for input(s): VITAMINB12, FOLATE, FERRITIN, TIBC, IRON, RETICCTPCT in the last 72 hours. Urinalysis     Component Value Date/Time   COLORURINE YELLOW 04/03/2020 1744   APPEARANCEUR CLEAR 04/03/2020 1744   LABSPEC >1.046 (H) 04/03/2020 1744   PHURINE 5.0 04/03/2020 1744   GLUCOSEU NEGATIVE 04/03/2020 1744   HGBUR NEGATIVE 04/03/2020 1744   BILIRUBINUR NEGATIVE 04/03/2020 1744   KETONESUR NEGATIVE 04/03/2020 1744   PROTEINUR NEGATIVE 04/03/2020 1744   NITRITE NEGATIVE 04/03/2020 1744   LEUKOCYTESUR NEGATIVE 04/03/2020 1744   Sepsis Labs Invalid input(s): PROCALCITONIN,  WBC,  LACTICIDVEN Microbiology Recent Results (from the past 240 hour(s))  SARS CORONAVIRUS 2 (TAT 6-24 HRS) Nasopharyngeal Nasopharyngeal Swab     Status: None   Collection Time: 04/04/20  3:06 PM   Specimen: Nasopharyngeal Swab  Result Value Ref Range Status   SARS Coronavirus 2 NEGATIVE NEGATIVE Final    Comment: (NOTE) SARS-CoV-2 target nucleic acids are NOT DETECTED.  The SARS-CoV-2 RNA is generally detectable in upper and lower respiratory specimens during the acute phase of infection. Negative results do not preclude SARS-CoV-2 infection, do not rule out co-infections with other pathogens, and should not be used as the sole basis  for treatment or other patient management decisions. Negative results must be combined with clinical observations, patient history, and epidemiological information. The expected result is Negative.  Fact Sheet for Patients: SugarRoll.be  Fact Sheet for Healthcare Providers: https://www.woods-mathews.com/  This test is not yet approved or cleared by the [place] FDA and  has been authorized for detection and/or diagnosis of SARS-CoV-2 by FDA under an Emergency Use Authorization (EUA). This EUA will remain  in effect (meaning this test can be used) for the duration of the COVID-19 declaration under Se ction 564(b)(1) of the Act, 21 U.S.C. section 360bbb-3(b)(1), unless the authorization is terminated or revoked sooner.  Performed  at [facility], [place] [address], [place], [place] [address]   Blood culture (routine x 2)     Status: None (Preliminary result)   Collection Time: 04/04/20  3:36 PM   Specimen: BLOOD  Result Value Ref Range Status   Specimen Description BLOOD BLOOD RIGHT FOREARM  Final   Special Requests   Final    BOTTLES DRAWN AEROBIC AND ANAEROBIC Blood Culture adequate volume   Culture   Final    NO GROWTH < 24 HOURS Performed at [facility], [address] [place], [place] [address]    Report Status PENDING  Incomplete  Blood culture (routine x 2)     Status: None (Preliminary result)   Collection Time: 04/04/20  3:37 PM   Specimen: BLOOD  Result Value Ref Range Status   Specimen Description BLOOD BLOOD RIGHT HAND  Final   Special Requests   Final    BOTTLES DRAWN AEROBIC AND ANAEROBIC Blood Culture adequate volume   Culture   Final    NO GROWTH < 24 HOURS Performed at [facility], [address] [place], [place] [address]    Report Status PENDING  Incomplete     Time coordinating discharge: Over 30 minutes  SIGNED:   Wyvonnia Dusky, MD  [facility] 04/05/2020, 1:52 PM Pager   If 7PM-7AM, please contact night-coverage

## 2020-04-05 NOTE — Progress Notes (Signed)
@   900 patient talked to this nurse about leaving AMA. MD Jimmye Norman secured texted to make aware.  MD was able to come to bedside to see patient.    @1100  Patient verbalizes again wanting to leave AMA. MD Jimmye Norman made aware.   AMA document signed.  2 IVs removed. No complications.    [place]

## 2020-04-05 NOTE — Consult Note (Addendum)
[facility] for Warfarin with IV Heparin bridge until therapeutic Indication: Mechanical mitral valve   Heparin dosing weight: 78.4 kg  Labs: Recent Labs    04/03/20 1330 04/04/20 0945 04/05/20 0119 04/05/20 0528 04/05/20 0748  HGB 14.5 13.1  --   --  13.3  HCT 43.7 39.5  --   --  38.7  PLT 338 326  --   --  307  APTT  --   --  156*  --   --   LABPROT 17.0*  --   --   --  16.7*  INR 1.4*  --   --   --  1.4*  HEPARINUNFRC  --   --  0.60  --  0.81*  CREATININE 0.85 0.71  --  0.67  --     Estimated Creatinine Clearance: 101.7 mL/min (by C-G formula based on SCr of 0.67 mg/dL).   Medical History: Past Medical History:  Diagnosis Date  . Abnormal Pap smear of cervix   . Asthma   . Dyspnea   . Heart disease    cardial murmur  . Hyperlipidemia   . Hypertension   . Mitral regurgitation   . Trichomoniasis    Medications:  Warfarin 5 mg SuTh, 6.5 mg TuSa, 8 mg MWF (see cardiology office telephone encounter 03/12/20)  Medication reconciliation technician was unable to verify dosing regimen with patient. Patient reports taking 8 mg and 5 mg doses on varying days.   Patient only appears to fill warfarin 5 mg and warfarin 4 mg tablets. Unclear how patient would take a 6.5 mg dose. Attempted to call cardiology office but they were closed. Will need to follow-up next week.  Assessment: Patient is a 40 y/o F with medical history as above and including rheumatic fever status post mechanical mitral valve replacement on warfarin who presented to the ED 1/14 with LLQ abdominal pain. Patient was apparently taking Bactrim recently for a UTI and has completed the prescription. Pharmacy consulted to manage warfarin.  Albumin: 4.1  Date INR Plan  1/14 1.4 Warfarin 10 mg  1/15 1.4        Goal of Therapy:  INR 2.5 - 3.5   Plan:  Warfarin --INR is subtherapeutic. Will give warfarin 10 mg x 1 dose again tonight. Patient reports she has missed  several doses as she was NPO DDI: Metronidazole, Cefazolin --Daily INR per protocol  Heparin --Daily CBC per protocol while on heparin infusion --Bridge until therapeutic on warfarin   0115 0119 HL 0.60, therapeutic.  Will continue Heparin at current rate 1200 units/hr and recheck HL in 6 hrs to confirm.  Monitor CBC  0115 0748 HL 0.81 supratherapetuic. Will decrease rate to 1050 units/hr and recheck HL in 6 hrs.  Antwone Capozzoli A 04/05/2020,8:37 AM

## 2020-04-08 LAB — EBV AB TO VIRAL CAPSID AG PNL, IGG+IGM
EBV VCA IgG: >600 U/mL — ABNORMAL HIGH (ref 0.0–17.9)
EBV VCA IgM: 53 U/mL — ABNORMAL HIGH (ref 0.0–35.9)

## 2020-04-08 LAB — HEPATITIS B E ANTIGEN: Hep B E Ag: NEGATIVE

## 2020-04-08 LAB — CMV DNA, QUANTITATIVE, PCR
CMV DNA Quant: NEGATIVE IU/mL
Log10 CMV Qn DNA Pl: UNDETERMINED log10 IU/mL

## 2020-04-08 LAB — ANA: Anti Nuclear Antibody (ANA): NEGATIVE

## 2020-04-08 LAB — ANTI-MICROSOMAL ANTIBODY LIVER / KIDNEY: LKM1 Ab: 0.5 Units (ref 0.0–20.0)

## 2020-04-08 LAB — ANTI-SMOOTH MUSCLE ANTIBODY, IGG: F-Actin IgG: 5 Units (ref 0–19)

## 2020-04-08 LAB — HSV(HERPES SIMPLEX VRS) I + II AB-IGM: HSVI/II Comb IgM: 1.27 Ratio — ABNORMAL HIGH (ref 0.00–0.90)

## 2020-04-09 ENCOUNTER — Emergency Department
Admission: EM | Admit: 2020-04-09 | Discharge: 2020-04-09 | Disposition: A | Discharge status: Home / Self Care | Payer: Medicaid Other | Attending: Emergency Medicine | Admitting: Emergency Medicine

## 2020-04-09 ENCOUNTER — Telehealth: Payer: Self-pay

## 2020-04-09 ENCOUNTER — Encounter: Payer: Self-pay | Admitting: Emergency Medicine

## 2020-04-09 ENCOUNTER — Encounter: Payer: Self-pay | Admitting: Gastroenterology

## 2020-04-09 ENCOUNTER — Other Ambulatory Visit: Payer: Self-pay

## 2020-04-09 ENCOUNTER — Emergency Department: Payer: Medicaid Other

## 2020-04-09 DIAGNOSIS — R110 Nausea: Secondary | ICD-10-CM

## 2020-04-09 DIAGNOSIS — R1011 Right upper quadrant pain: Secondary | ICD-10-CM | POA: Diagnosis present

## 2020-04-09 DIAGNOSIS — B2790 Infectious mononucleosis, unspecified without complication: Secondary | ICD-10-CM | POA: Insufficient documentation

## 2020-04-09 DIAGNOSIS — Z7901 Long term (current) use of anticoagulants: Secondary | ICD-10-CM | POA: Diagnosis not present

## 2020-04-09 DIAGNOSIS — I10 Essential (primary) hypertension: Secondary | ICD-10-CM | POA: Insufficient documentation

## 2020-04-09 DIAGNOSIS — Z79899 Other long term (current) drug therapy: Secondary | ICD-10-CM | POA: Insufficient documentation

## 2020-04-09 DIAGNOSIS — R791 Abnormal coagulation profile: Secondary | ICD-10-CM

## 2020-04-09 DIAGNOSIS — F17210 Nicotine dependence, cigarettes, uncomplicated: Secondary | ICD-10-CM | POA: Insufficient documentation

## 2020-04-09 DIAGNOSIS — Z7982 Long term (current) use of aspirin: Secondary | ICD-10-CM | POA: Diagnosis not present

## 2020-04-09 DIAGNOSIS — J45909 Unspecified asthma, uncomplicated: Secondary | ICD-10-CM | POA: Diagnosis not present

## 2020-04-09 DIAGNOSIS — F1590 Other stimulant use, unspecified, uncomplicated: Secondary | ICD-10-CM | POA: Diagnosis not present

## 2020-04-09 DIAGNOSIS — Z91040 Latex allergy status: Secondary | ICD-10-CM | POA: Diagnosis not present

## 2020-04-09 DIAGNOSIS — I4891 Unspecified atrial fibrillation: Secondary | ICD-10-CM | POA: Insufficient documentation

## 2020-04-09 DIAGNOSIS — B178 Other specified acute viral hepatitis: Secondary | ICD-10-CM

## 2020-04-09 DIAGNOSIS — B2709 Gammaherpesviral mononucleosis with other complications: Secondary | ICD-10-CM

## 2020-04-09 DIAGNOSIS — Z7952 Long term (current) use of systemic steroids: Secondary | ICD-10-CM | POA: Diagnosis not present

## 2020-04-09 LAB — CBC WITH DIFFERENTIAL/PLATELET
Abs Immature Granulocytes: 0.02 10*3/uL (ref 0.00–0.07)
Basophils Absolute: 0 10*3/uL (ref 0.0–0.1)
Basophils Relative: 0 %
Eosinophils Absolute: 0 10*3/uL (ref 0.0–0.5)
Eosinophils Relative: 1 %
HCT: 38.5 % (ref 36.0–46.0)
Hemoglobin: 13.2 g/dL (ref 12.0–15.0)
Immature Granulocytes: 0 %
Lymphocytes Relative: 34 %
Lymphs Abs: 2.6 10*3/uL (ref 0.7–4.0)
MCH: 32 pg (ref 26.0–34.0)
MCHC: 34.3 g/dL (ref 30.0–36.0)
MCV: 93.2 fL (ref 80.0–100.0)
Monocytes Absolute: 0.5 10*3/uL (ref 0.1–1.0)
Monocytes Relative: 6 %
Neutro Abs: 4.6 10*3/uL (ref 1.7–7.7)
Neutrophils Relative %: 59 %
Platelets: 388 10*3/uL (ref 150–400)
RBC: 4.13 MIL/uL (ref 3.87–5.11)
RDW: 13.9 % (ref 11.5–15.5)
WBC: 7.8 10*3/uL (ref 4.0–10.5)
nRBC: 0 % (ref 0.0–0.2)

## 2020-04-09 LAB — COMPREHENSIVE METABOLIC PANEL
ALT: 139 U/L — ABNORMAL HIGH (ref 0–44)
AST: 59 U/L — ABNORMAL HIGH (ref 15–41)
Albumin: 4 g/dL (ref 3.5–5.0)
Alkaline Phosphatase: 300 U/L — ABNORMAL HIGH (ref 38–126)
Anion gap: 10 (ref 5–15)
BUN: 10 mg/dL (ref 6–20)
CO2: 24 mmol/L (ref 22–32)
Calcium: 9.5 mg/dL (ref 8.9–10.3)
Chloride: 102 mmol/L (ref 98–111)
Creatinine, Ser: 0.77 mg/dL (ref 0.44–1.00)
GFR, Estimated: >60 mL/min (ref >60)
Glucose, Bld: 156 mg/dL — ABNORMAL HIGH (ref 70–99)
Potassium: 4 mmol/L (ref 3.5–5.1)
Sodium: 136 mmol/L (ref 135–145)
Total Bilirubin: 0.5 mg/dL (ref 0.3–1.2)
Total Protein: 8 g/dL (ref 6.5–8.1)

## 2020-04-09 LAB — IGG 4: IgG, Subclass 4: 15 mg/dL (ref 2–96)

## 2020-04-09 LAB — PROTIME-INR
INR: 1.8 — ABNORMAL HIGH (ref 0.8–1.2)
Prothrombin Time: 20 seconds — ABNORMAL HIGH (ref 11.4–15.2)

## 2020-04-09 LAB — CULTURE, BLOOD (ROUTINE X 2)
Culture: NO GROWTH
Culture: NO GROWTH
Special Requests: ADEQUATE
Special Requests: ADEQUATE

## 2020-04-09 LAB — LIPASE, BLOOD: Lipase: 87 U/L — ABNORMAL HIGH (ref 11–51)

## 2020-04-09 IMAGING — CR DG CHEST 2V
1 series · 2 of 2 positions shown · non-contrast
Comparison: None.

CLINICAL DATA: Chest tightness x2 days.

EXAM:
CHEST - 2 VIEW

[Series 1: dg chest 2 view · 0.14mm/px · 2 of 2 slices shown]
[im 1/2]
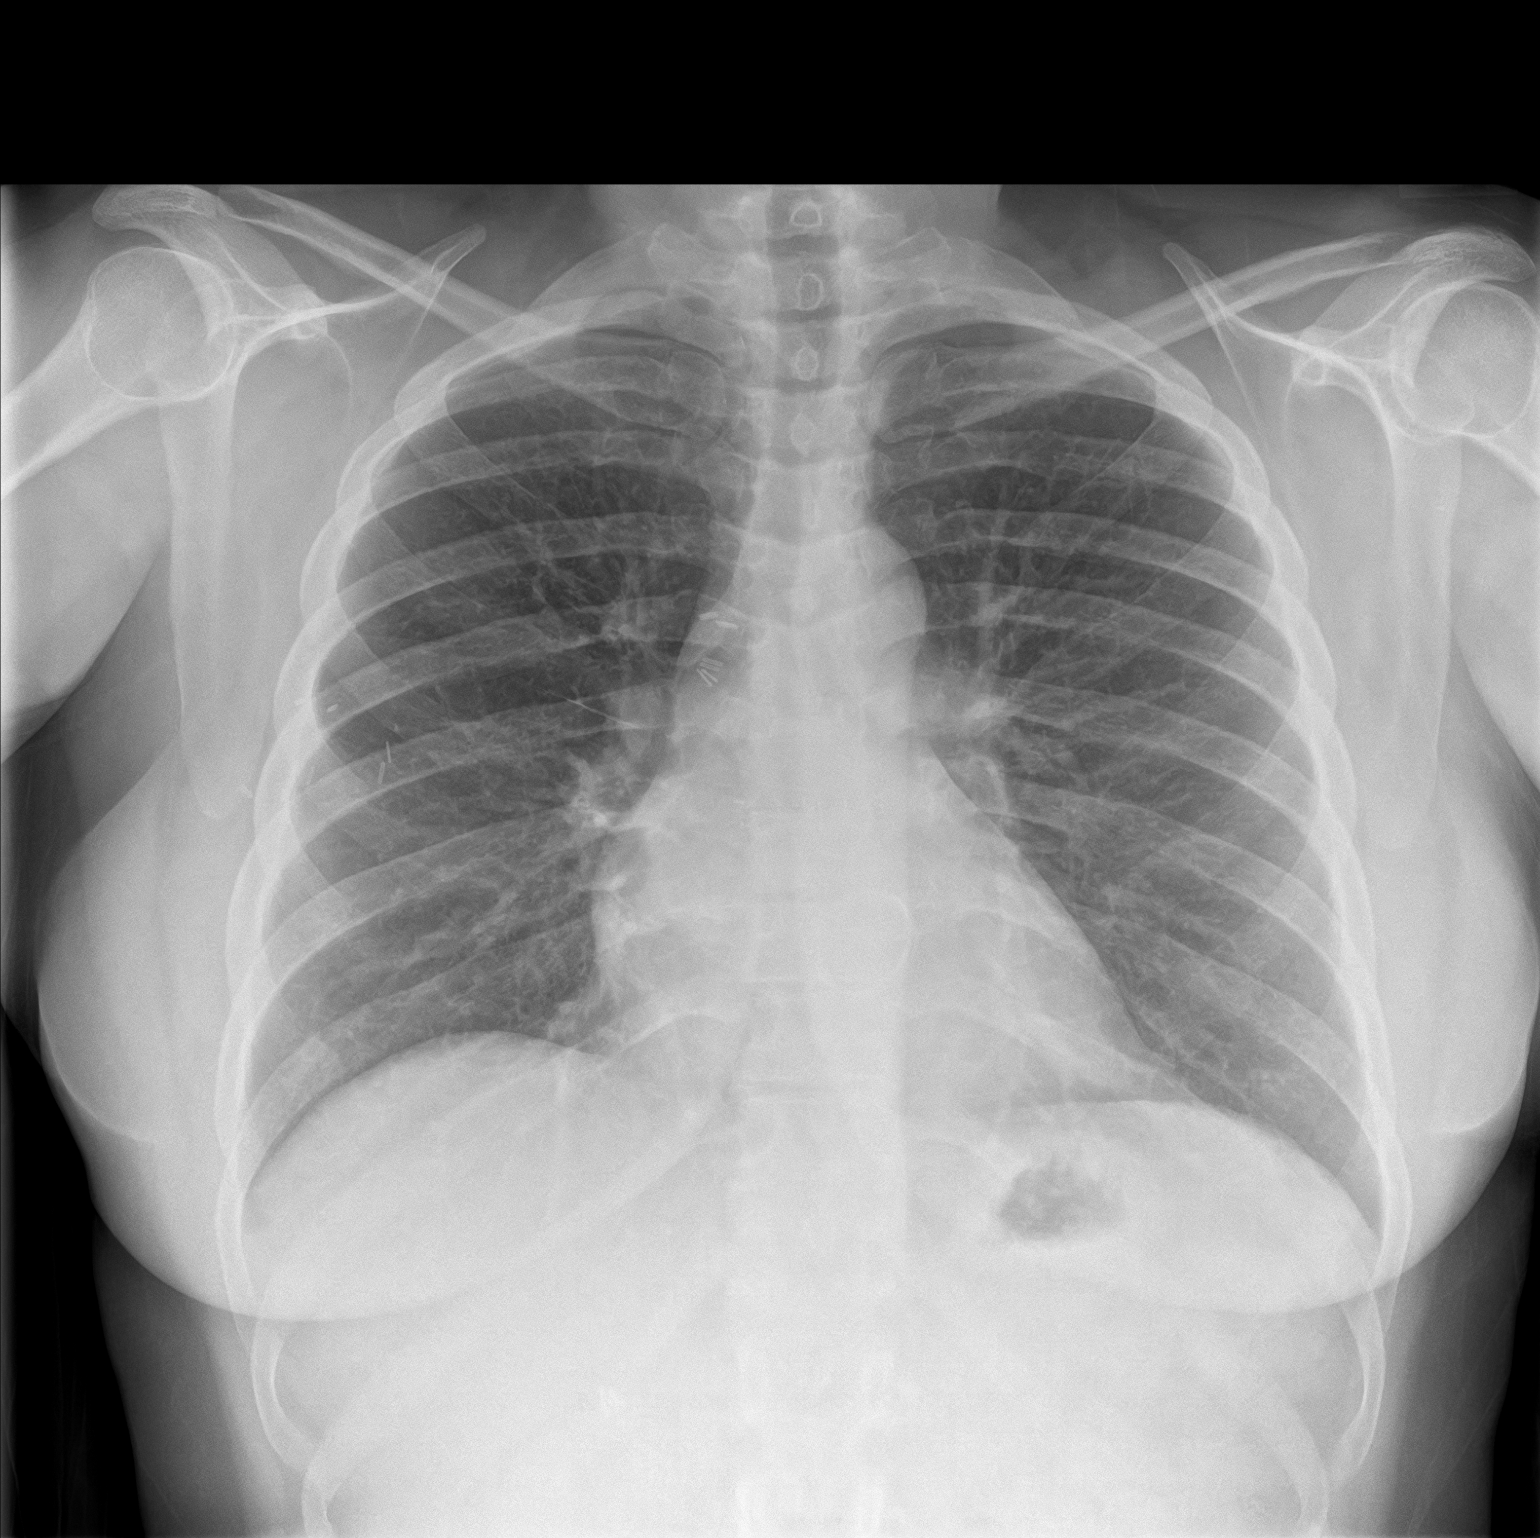
[im 2/2]
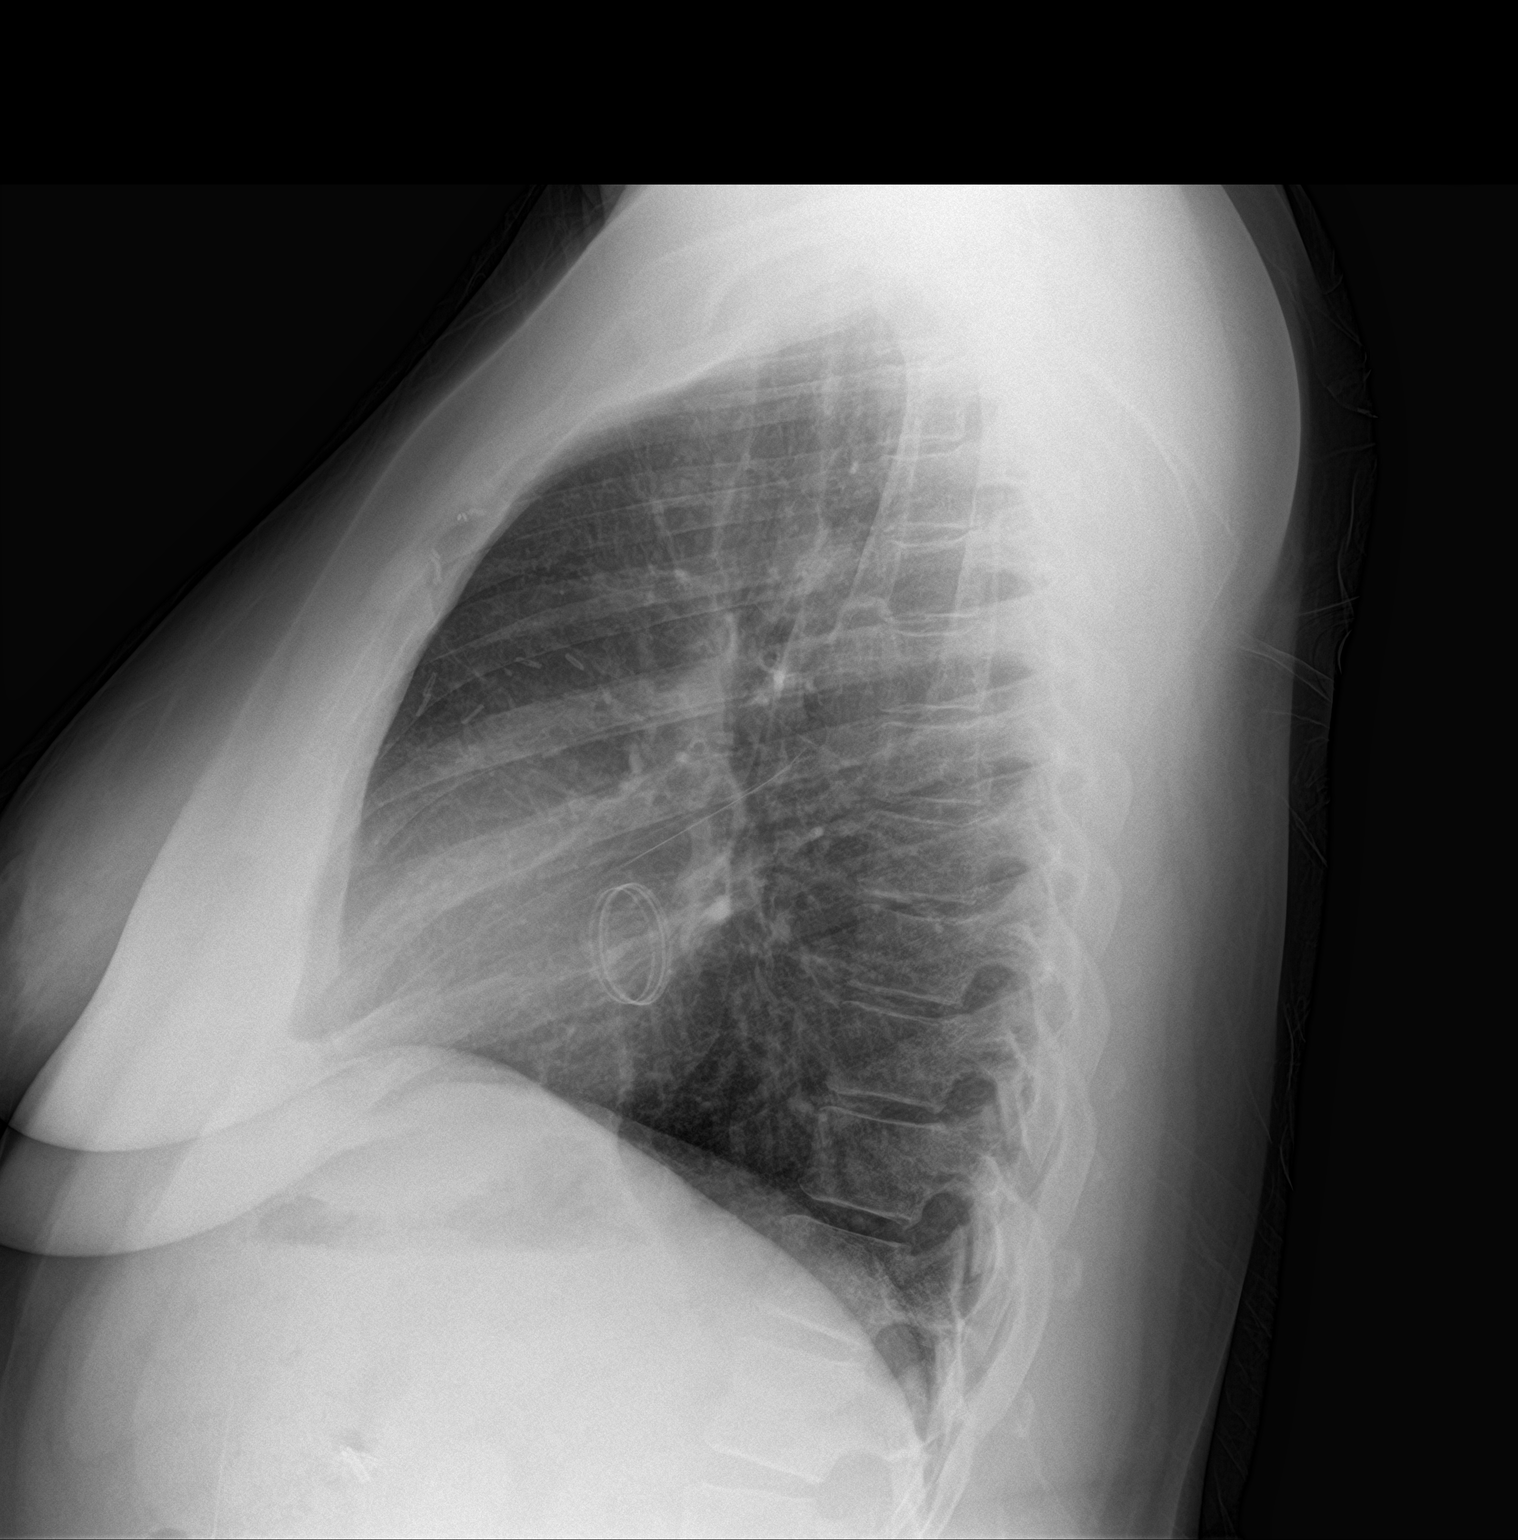

[2 of 2 positions shown; findings below may reference images not displayed]

FINDINGS: Prostatic cardiac valve. Surgical clips overlie the mediastinum and
right chest. Cardiomediastinal silhouette is within normal limits.
No focal consolidation. No pleural effusion or pneumothorax. The
visualized skeletal structures are unremarkable. Cholecystectomy
clips.
IMPRESSION: No active cardiopulmonary disease.

## 2020-04-09 MED ORDER — ONDANSETRON 4 MG PO TBDP: 4.0000 mg | ORAL_TABLET | Freq: Three times a day (TID) | ORAL | 0 refills | Status: DC | PRN

## 2020-04-09 NOTE — ED Provider Notes (Signed)
[facility] Emergency Department Provider Note  ____________________________________________   Event Date/Time   First MD Initiated Contact with Patient 04/09/20 1454     (approximate)  I have reviewed the triage vital signs and the nursing notes.   HISTORY  Chief Complaint Abdominal Pain   HPI Anne Kirby is a 40 y.o. 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  Patient did leave AMA but she states that since leaving she has had persistent pain in her right upper quadrant associate with nausea and decreased appetite.  She also states that yesterday she has some chest tightness in her left chest but this is now resolved.  Is not associated with cough or shortness of breath.  She denies any vomiting, diarrhea, dysuria, back pain, headache, earache, sore throat, rash or extremity pain.  No recent falls or injuries.  He has not had any alcohol, Tylenol or NSAIDs.  Has been taking her nausea medicine but still has had very poor appetite and poor p.o. intake.  No other acute concerns at this time.         Past Medical History:  Diagnosis Date  . Abnormal Pap smear of cervix   . Asthma   . Dyspnea   . Heart disease    cardial murmur  . Hyperlipidemia   . Hypertension   . Mitral regurgitation   . Trichomoniasis     Patient Active Problem List   Diagnosis Date Noted  . Abdominal pain 04/04/2020  . Essential hypertension 04/04/2020  . Sprain of medial collateral ligament of right knee 07/09/2016  . Severe mitral regurgitation 07/02/2016  . Migraine headache 07/02/2016  . Atrial fibrillation, transient ([place]) 07/02/2016  . Acid reflux 01/24/2014  . Hyperlipidemia, mixed 08/24/2013  .  Current tobacco use 08/24/2013    Past Surgical History:  Procedure Laterality Date  . MITRAL VALVE REPLACEMENT  11/20/13    Prior to Admission medications   Medication Sig Start Date End Date Taking? Authorizing Provider  albuterol (PROVENTIL HFA;VENTOLIN HFA) 108 (90 Base) MCG/ACT inhaler Inhale 2 puffs into the lungs every 4 (four) hours as needed for wheezing or shortness of breath.    [provider]  albuterol (PROVENTIL) (2.5 MG/3ML) 0.083% nebulizer solution Take 2.5 mg by nebulization 3 (three) times daily.    [provider]  amLODipine (NORVASC) 10 MG tablet Take 10 mg by mouth daily.  01/19/17   [provider]  aspirin 81 MG chewable tablet Chew 81 mg by mouth daily.    [provider]  atorvastatin (LIPITOR) 20 MG tablet Take 20 mg by mouth daily. 05/01/19   [provider]  carvedilol (COREG) 3.125 MG tablet Take 3.125 mg by mouth 2 (two) times daily. 05/01/19   [provider]  cyclobenzaprine (FLEXERIL) 10 MG tablet Take 10 mg by mouth 3 (three) times daily as needed for muscle spasms.    [provider]  diazepam (VALIUM) 5 MG tablet Take 5 mg by mouth every 12 (twelve) hours as needed. 11/02/19   [provider]  ferrous sulfate 324 (65 Fe) MG TBEC Take 1 tablet by mouth daily.    [provider]  FLUTICASONE PROPIONATE, INHAL, IN Inhale into the lungs daily.    [provider]  gabapentin (NEURONTIN)  300 MG capsule Take 600 mg by mouth 2 (two) times daily.     [provider]  ondansetron (ZOFRAN ODT) 4 MG disintegrating tablet Take 1 tablet (4 mg total) by mouth every 8 (eight) hours as needed for nausea or vomiting. 04/09/20   Lucrezia Starch, MD  pantoprazole (PROTONIX) 40 MG tablet Take 40 mg by mouth daily.    [provider]  SUMAtriptan (IMITREX) 100 MG tablet Take 100 mg by mouth every 2 (two) hours as needed for migraine. May repeat in 2 hours if headache persists  or recurs.    [provider]  warfarin (COUMADIN) 4 MG tablet Take 4 mg by mouth. Takes 2 tabs once a day on Thursday, Friday, Saturday, Sunday.    [provider]  warfarin (COUMADIN) 5 MG tablet Take 5 mg by mouth daily. Take 1 tablet daily on Monday, Tuesday, and Wednesday.    [provider]    Allergies Bactrim [sulfamethoxazole-trimethoprim], Tramadol, and Latex  Family History  Problem Relation Age of Onset  . Diabetes Mother   . Heart disease Father   . Heart attack Father   . Hypertension Other   . Stroke Other     Social History Social History   Tobacco Use  . Smoking status: Current Every Day Smoker    Packs/day: 0.25    Years: 15.00    Pack years: 3.75    Types: Cigarettes  . Smokeless tobacco: Never Used  Substance Use Topics  . Alcohol use: No  . Drug use: Yes    Types: Marijuana    Comment: occasional    Review of Systems  Review of Systems  Constitutional: Positive for malaise/fatigue. Negative for chills and fever.  HENT: Negative for sore throat.   Eyes: Negative for pain.  Respiratory: Negative for cough and stridor.   Cardiovascular: Negative for chest pain.  Gastrointestinal: Positive for abdominal pain and nausea. Negative for vomiting.  Genitourinary: Negative for dysuria.  Musculoskeletal: Negative for myalgias.  Skin: Negative for rash.  Neurological: Negative for seizures, loss of consciousness and headaches.  Psychiatric/Behavioral: Negative for suicidal ideas.  All other systems reviewed and are negative.     ____________________________________________   PHYSICAL EXAM:  VITAL SIGNS: ED Triage Vitals  Enc Vitals Group     BP 04/09/20 1237 118/81     Pulse Rate 04/09/20 1237 85     Resp 04/09/20 1237 16     Temp 04/09/20 1237 98.7 F (37.1 C)     Temp Source 04/09/20 1237 Oral     SpO2 04/09/20 1237 99 %     Weight 04/09/20 1131 185 lb (83.9 kg)     Height 04/09/20 1131 '5\' 7"'  (1.702 m)     Head  Circumference --      Peak Flow --      Pain Score 04/09/20 1131 10     Pain Loc --      Pain Edu? --      Excl. in [place]? --    Vitals:   04/09/20 1237  BP: 118/81  Pulse: 85  Resp: 16  Temp: 98.7 F (37.1 C)  SpO2: 99%   Physical Exam Vitals and nursing note reviewed.  Constitutional:      General: She is not in acute distress.    Appearance: She is well-developed and well-nourished.  HENT:     Head: Normocephalic and atraumatic.     Right Ear: External ear normal.     Left Ear:  External ear normal.     Nose: Nose normal.  Eyes:     Conjunctiva/sclera: Conjunctivae normal.  Cardiovascular:     Rate and Rhythm: Normal rate and regular rhythm.     Pulses: Normal pulses.     Heart sounds: No murmur heard.   Pulmonary:     Effort: Pulmonary effort is normal. No respiratory distress.     Breath sounds: Normal breath sounds.  Abdominal:     Palpations: Abdomen is soft.     Tenderness: There is abdominal tenderness in the right upper quadrant. There is no right CVA tenderness or left CVA tenderness.  Musculoskeletal:        General: No edema.     Cervical back: Neck supple.  Skin:    General: Skin is warm and dry.  Neurological:     Mental Status: She is alert and oriented to person, place, and time.  Psychiatric:        Mood and Affect: Mood and affect and mood normal.      ____________________________________________   LABS (all labs ordered are listed, but only abnormal results are displayed)  Labs Reviewed  COMPREHENSIVE METABOLIC PANEL - Abnormal; Notable for the following components:      Result Value   Glucose, Bld 156 (*)    AST 59 (*)    ALT 139 (*)    Alkaline Phosphatase 300 (*)    All other components within normal limits  LIPASE, BLOOD - Abnormal; Notable for the following components:   Lipase 87 (*)    All other components within normal limits  PROTIME-INR - Abnormal; Notable for the following components:   Prothrombin Time 20.0 (*)    INR  1.8 (*)    All other components within normal limits  CBC WITH DIFFERENTIAL/PLATELET  URINALYSIS, COMPLETE (UACMP) WITH MICROSCOPIC  POC URINE PREG, ED   ____________________________________________  EKG  Sinus rhythm with ventricular rate of 75, normal axis, very slight first-degree interval with a PR interval of 212 and otherwise unremarkable intervals and no clear evidence of acute ischemia or significant underlying arrhythmia. ____________________________________________  RADIOLOGY  ED MD interpretation: Chest x-ray shows no effusion, pneumothorax, focal consolidation, edema, refracture, or other clear acute intrathoracic process.   Official radiology report(s): DG Chest 2 View  Result Date: 04/09/2020 CLINICAL DATA:  Chest tightness x2 days. EXAM: CHEST - 2 VIEW COMPARISON:  None. FINDINGS: Prostatic cardiac valve. Surgical clips overlie the mediastinum and right chest. Cardiomediastinal silhouette is within normal limits. No focal consolidation. No pleural effusion or pneumothorax. The visualized skeletal structures are unremarkable. Cholecystectomy clips. IMPRESSION: No active cardiopulmonary disease. Electronically Signed   By: Dahlia Bailiff MD   On: 04/09/2020 15:58    ____________________________________________   PROCEDURES  Procedure(s) performed (including Critical Care):  Procedures   ____________________________________________   INITIAL IMPRESSION / ASSESSMENT AND PLAN / ED COURSE      Patient presents with left history exam for assessment of persistent right upper quadrant abdominal pain associate with some nausea and decreased appetite.  She is also noted she has some tightness in her left chest yesterday that seems little better today.  She is afebrile and hemodynamically stable on arrival.  I did review patient's recent records including her recent hospitalization and it seems she was being worked up for autoimmune versus infectious etiology of her right  upper quadrant pain.  There are also some concern for possible passed stone as she had some evidence of mild pancreatitis in addition to transaminitis  and elevated alk phos.  MRCP obtained on admission earlier this month not show evidence of stone or clear obstruction at that time.  Blood cultures were reviewed and did not grow anything over 5 days although patient was net positive EBV and HSV antibody titers.  Suspect symptoms may be related to EBV she is not febrile and denies any other symptoms that would suggest disseminated HSV at this time.  She denies any masses or lesions in her oropharynx or the genital area.  Regards to her tightness in her left chest EKG shows no evidence of ischemia chest x-ray unremarkable for pneumonia, thorax, effusion or other clear acute process.  Patient denies any cough no fever elevated white blood cell count to suggest acute infectious process in the chest.  MSK significant differential ambulates patient for ACS or PE at this time.   CMP obtained today remarkable for AST of 59 compared to 123 4 days ago as well as an ALT of 139 compared to 238 4 days ago and alk phos of 300 compared to 334 days ago.  No other significant electrolyte or metabolic derangements today.  Bilirubin is normal.  CBC shows no leukocytosis or acute anemia.  Platelets are unremarkable.  Lipase today is 87 compared to 137 5 days ago.  Is consistent with resolving mild pancreatitis.  INR today is 1.8 which is slightly subtherapeutic.  Given no other acute symptoms as described above which are consistent with same symptoms she was discharged with a low suspicion for other Mi (pathology at this time including diverticulitis, SBO, pyelonephritis, appendicitis or other immediate life-threatening process.  Did discuss patient's presentation and ED work-up today as well as recent hospitalization with on-call gastroenterologist Dr. Haig Prophet who stated findings were consistent with likely resolving EBV  infection and possible passed stone.  Recommended continued supportive therapy and outpatient PCP follow-up.  Patient may take Tylenol but limited to 2 g/day.  Advised patient that her INR was subtherapeutic today and that she should speak with her Coumadin prescribing physician to ask if she should increase her dosage temporarily for a day or 2.  Patient voiced understanding and agreement this plan.  Discharged stable condition.  Strict return precautions advised discussed.       ____________________________________________   FINAL CLINICAL IMPRESSION(S) / ED DIAGNOSES  Final diagnoses:  Right upper quadrant abdominal pain  Nausea  EBV hepatitis  Subtherapeutic international normalized ratio (INR)    Medications - No data to display   ED Discharge Orders         Ordered    ondansetron (ZOFRAN ODT) 4 MG disintegrating tablet  Every 8 hours PRN        04/09/20 1605           Note:  This document was prepared using Dragon voice recognition software and may include unintentional dictation errors.   Lucrezia Starch, MD 04/09/20 [phone]

## 2020-04-09 NOTE — ED Triage Notes (Signed)
Pt comes into the Ed via POV c/o abdominal pain that has been ongoing for a while.  Pt was seen at [facility] and here Friday.  Pt had to leave AMA for child custody issues.  Pt still having the pain with nausea and vomiting.  Pt was supposed to have MRCP completed.

## 2020-04-09 NOTE — Telephone Encounter (Signed)
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 Inform that she should follow up with her PCP to ensure she is feeling ok/labs review and likely may need referral to infectious d iseases.

## 2020-04-09 NOTE — ED Notes (Signed)
Pt called to triage for VS and lab draw

## 2020-04-09 NOTE — Progress Notes (Signed)
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 Inform that she should follow up with her PCP to ensure she is feeling ok/labs review and likely may need referral to infectious diseases.

## 2020-04-09 NOTE — Telephone Encounter (Signed)
Tried to call patient but voicemail is not set up  

## 2020-04-10 LAB — HEPATITIS C GENOTYPE

## 2020-04-11 LAB — HEPATITIS B DNA, ULTRAQUANTITATIVE, PCR
HBV DNA SERPL PCR-ACNC: NOT DETECTED IU/mL
HBV DNA SERPL PCR-LOG IU: UNDETERMINED log10 IU/mL

## 2020-06-30 ENCOUNTER — Ambulatory Visit: Admit: 2020-06-30 | Payer: Medicaid Other

## 2020-06-30 SURGERY — ESOPHAGOGASTRODUODENOSCOPY (EGD) WITH PROPOFOL
Anesthesia: General

## 2021-01-16 ENCOUNTER — Encounter: Payer: Self-pay | Admitting: *Deleted

## 2021-01-19 ENCOUNTER — Ambulatory Visit
Admission: RE | Admit: 2021-01-19 | Discharge: 2021-01-19 | Disposition: A | Discharge status: Home / Self Care | Payer: Medicaid Other | Attending: Gastroenterology | Admitting: Gastroenterology

## 2021-01-19 ENCOUNTER — Ambulatory Visit: Payer: Medicaid Other | Admitting: Anesthesiology

## 2021-01-19 ENCOUNTER — Encounter: Payer: Self-pay | Admitting: Anesthesiology

## 2021-01-19 ENCOUNTER — Encounter
Admission: RE | Disposition: A | Discharge status: Home / Self Care | Payer: Self-pay | Source: Home / Self Care | Attending: Gastroenterology

## 2021-01-19 DIAGNOSIS — K219 Gastro-esophageal reflux disease without esophagitis: Secondary | ICD-10-CM | POA: Insufficient documentation

## 2021-01-19 DIAGNOSIS — R194 Change in bowel habit: Secondary | ICD-10-CM | POA: Insufficient documentation

## 2021-01-19 DIAGNOSIS — Z79899 Other long term (current) drug therapy: Secondary | ICD-10-CM | POA: Diagnosis not present

## 2021-01-19 DIAGNOSIS — R1013 Epigastric pain: Secondary | ICD-10-CM | POA: Insufficient documentation

## 2021-01-19 DIAGNOSIS — I10 Essential (primary) hypertension: Secondary | ICD-10-CM | POA: Diagnosis not present

## 2021-01-19 DIAGNOSIS — D124 Benign neoplasm of descending colon: Secondary | ICD-10-CM | POA: Insufficient documentation

## 2021-01-19 DIAGNOSIS — Z9049 Acquired absence of other specified parts of digestive tract: Secondary | ICD-10-CM | POA: Insufficient documentation

## 2021-01-19 DIAGNOSIS — Z888 Allergy status to other drugs, medicaments and biological substances status: Secondary | ICD-10-CM | POA: Diagnosis not present

## 2021-01-19 DIAGNOSIS — K640 First degree hemorrhoids: Secondary | ICD-10-CM | POA: Diagnosis not present

## 2021-01-19 DIAGNOSIS — Z7982 Long term (current) use of aspirin: Secondary | ICD-10-CM | POA: Diagnosis not present

## 2021-01-19 DIAGNOSIS — J45909 Unspecified asthma, uncomplicated: Secondary | ICD-10-CM | POA: Insufficient documentation

## 2021-01-19 DIAGNOSIS — Z885 Allergy status to narcotic agent status: Secondary | ICD-10-CM | POA: Diagnosis not present

## 2021-01-19 DIAGNOSIS — Z952 Presence of prosthetic heart valve: Secondary | ICD-10-CM | POA: Diagnosis not present

## 2021-01-19 DIAGNOSIS — E669 Obesity, unspecified: Secondary | ICD-10-CM | POA: Diagnosis not present

## 2021-01-19 DIAGNOSIS — E785 Hyperlipidemia, unspecified: Secondary | ICD-10-CM | POA: Insufficient documentation

## 2021-01-19 DIAGNOSIS — Z881 Allergy status to other antibiotic agents status: Secondary | ICD-10-CM | POA: Insufficient documentation

## 2021-01-19 DIAGNOSIS — Z6831 Body mass index (BMI) 31.0-31.9, adult: Secondary | ICD-10-CM | POA: Insufficient documentation

## 2021-01-19 DIAGNOSIS — Z7901 Long term (current) use of anticoagulants: Secondary | ICD-10-CM | POA: Insufficient documentation

## 2021-01-19 HISTORY — DX: Gastro-esophageal reflux disease without esophagitis: K21.9

## 2021-01-19 HISTORY — DX: Headache, unspecified: R51.9

## 2021-01-19 HISTORY — PX: COLONOSCOPY WITH PROPOFOL: SHX5780

## 2021-01-19 HISTORY — PX: ESOPHAGOGASTRODUODENOSCOPY (EGD) WITH PROPOFOL: SHX5813

## 2021-01-19 LAB — PROTIME-INR
INR: 1.1 (ref 0.8–1.2)
Prothrombin Time: 13.8 seconds (ref 11.4–15.2)

## 2021-01-19 SURGERY — ESOPHAGOGASTRODUODENOSCOPY (EGD) WITH PROPOFOL
Anesthesia: General

## 2021-01-19 MED ORDER — PROPOFOL 10 MG/ML IV BOLUS
INTRAVENOUS | Status: AC
  Filled 2021-01-19: qty 20

## 2021-01-19 MED ORDER — PHENYLEPHRINE HCL (PRESSORS) 10 MG/ML IV SOLN
INTRAVENOUS | Status: AC
  Filled 2021-01-19: qty 1

## 2021-01-19 MED ORDER — SODIUM CHLORIDE 0.9 % IV SOLN
2.0000 g | Freq: Once | INTRAVENOUS | Status: AC
  Administered 2021-01-19: 2 g via INTRAVENOUS
  Filled 2021-01-19: qty 2

## 2021-01-19 MED ORDER — PROPOFOL 500 MG/50ML IV EMUL
INTRAVENOUS | Status: DC | PRN
  Administered 2021-01-19: 120 ug/kg/min via INTRAVENOUS

## 2021-01-19 MED ORDER — FENTANYL CITRATE (PF) 100 MCG/2ML IJ SOLN
INTRAMUSCULAR | Status: DC | PRN
  Administered 2021-01-19: 50 ug via INTRAVENOUS

## 2021-01-19 MED ORDER — LIDOCAINE HCL (CARDIAC) PF 100 MG/5ML IV SOSY
PREFILLED_SYRINGE | INTRAVENOUS | Status: DC | PRN
  Administered 2021-01-19: 50 mg via INTRAVENOUS

## 2021-01-19 MED ORDER — FENTANYL CITRATE (PF) 100 MCG/2ML IJ SOLN
INTRAMUSCULAR | Status: AC
  Filled 2021-01-19: qty 2

## 2021-01-19 MED ORDER — PHENYLEPHRINE HCL (PRESSORS) 10 MG/ML IV SOLN
INTRAVENOUS | Status: DC | PRN
  Administered 2021-01-19 (×2): 50 ug via INTRAVENOUS

## 2021-01-19 MED ORDER — SODIUM CHLORIDE 0.9 % IV SOLN: INTRAVENOUS | Status: DC

## 2021-01-19 MED ORDER — PROPOFOL 500 MG/50ML IV EMUL
INTRAVENOUS | Status: AC
  Filled 2021-01-19: qty 50

## 2021-01-19 MED ORDER — LIDOCAINE HCL (PF) 2 % IJ SOLN
INTRAMUSCULAR | Status: AC
  Filled 2021-01-19: qty 5

## 2021-01-19 NOTE — Transfer of Care (Signed)
Immediate Anesthesia Transfer of Care Note  Patient: Anne Kirby  Procedure(s) Performed: ESOPHAGOGASTRODUODENOSCOPY (EGD) WITH PROPOFOL COLONOSCOPY WITH PROPOFOL  Patient Location: PACU  Anesthesia Type:General  Level of Consciousness: awake and sedated  Airway & Oxygen Therapy: Patient Spontanous Breathing and Patient connected to nasal cannula oxygen  Post-op Assessment: Report given to RN and Post -op Vital signs reviewed and stable  Post vital signs: Reviewed and stable  Last Vitals:  Vitals Value Taken Time  BP    Temp    Pulse    Resp    SpO2      Last Pain:  Vitals:   01/19/21 0858  TempSrc: Temporal  PainSc: 0-No pain         Complications: No notable events documented.

## 2021-01-19 NOTE — Op Note (Signed)
[facility] Gastroenterology Patient Name: Anne Kirby Procedure Date: 01/19/2021 8:58 AM MRN: [id number] Account #: [id number] Date of Birth: 31-Dec-1980 Admit Type: Outpatient Age: 41 Room: [facility] ENDO ROOM 3 Gender: Female Note Status: Finalized Instrument Name: Altamese Cabal Endoscope [id number] Procedure:             Upper GI endoscopy Indications:           Dyspepsia Providers:             Andrey Farmer MD, MD Medicines:             Monitored Anesthesia Care Complications:         No immediate complications. Estimated blood loss:                         Minimal. Procedure:             Pre-Anesthesia Assessment:                        - Prior to the procedure, a History and Physical was                         performed, and patient medications and allergies were                         reviewed. The patient is competent. The risks and                         benefits of the procedure and the sedation options and                         risks were discussed with the patient. All questions                         were answered and informed consent was obtained.                         Patient identification and proposed procedure were                         verified by the physician, the nurse, the anesthetist                         and the technician in the endoscopy suite. Mental                         Status Examination: alert and oriented. Airway                         Examination: normal oropharyngeal airway and neck                         mobility. Respiratory Examination: clear to                         auscultation. CV Examination: normal. Prophylactic                         Antibiotics: The patient requires prophylactic  antibiotics due to a prior history of prosthetic heart                         valve. Prior Anticoagulants: The patient has taken                         coumadin 5 days prior to procedure and lovenox 30                          hours prior to procedure. ASA Grade Assessment: III -                         A patient with severe systemic disease. After                         reviewing the risks and benefits, the patient was                         deemed in satisfactory condition to undergo the                         procedure. The anesthesia plan was to use monitored                         anesthesia care (MAC). Immediately prior to                         administration of medications, the patient was                         re-assessed for adequacy to receive sedatives. The                         heart rate, respiratory rate, oxygen saturations,                         blood pressure, adequacy of pulmonary ventilation, and                         response to care were monitored throughout the                         procedure. The physical status of the patient was                         re-assessed after the procedure.                        After obtaining informed consent, the endoscope was                         passed under direct vision. Throughout the procedure,                         the patient's blood pressure, pulse, and oxygen                         saturations were monitored continuously. The Endoscope  was introduced through the mouth, and advanced to the                         second part of duodenum. The upper GI endoscopy was                         accomplished without difficulty. The patient tolerated                         the procedure well. Findings:      The examined esophagus was normal. Biopsies were obtained from the       proximal and distal esophagus with cold forceps for histology of       suspected eosinophilic esophagitis. Estimated blood loss was minimal.      The entire examined stomach was normal. Biopsies were taken with a cold       forceps for Helicobacter pylori testing. Estimated blood loss was       minimal.      The  examined duodenum was normal. Impression:            - Normal esophagus. Biopsied.                        - Normal stomach. Biopsied.                        - Normal examined duodenum. Recommendation:        - Perform a colonoscopy today. Procedure Code(s):     --- Professional ---                        [phone], Esophagogastroduodenoscopy, flexible,                         transoral; with biopsy, single or multiple Diagnosis Code(s):     --- Professional ---                        R10.13, Epigastric pain CPT copyright 2019 American Medical Association. All rights reserved. The codes documented in this report are preliminary and upon coder review may  be revised to meet current compliance requirements. Andrey Farmer MD, MD 01/19/2021 10:07:08 AM Number of Addenda: 0 Note Initiated On: 01/19/2021 8:58 AM Estimated Blood Loss:  Estimated blood loss was minimal.      [facility]

## 2021-01-19 NOTE — H&P (Signed)
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 She takes coumadin for anticoagulation but has been bridged with last dose of coumadin 5 days ago and last dose of lovenox about 34 hours ago. History of cholecystectomy. No family history of GI malignancies or IBD.    Current Facility-Administered Medications:    0.9 %  sodium chloride infusion, , Intravenous, Continuous, Delmer Kowalski, Hilton Cork, MD   ampicillin (OMNIPEN) 2 g in sodium chloride 0.9 % 100 mL IVPB, 2 g, Intravenous, Once, Roque Schill, Hilton Cork, MD  Medications Prior to Admission  Medication Sig Dispense Refill Last Dose   amLODipine (NORVASC) 10 MG tablet Take 10 mg by mouth daily.    01/18/2021   aspirin 81 MG chewable tablet Chew 81 mg by mouth daily.   01/18/2021   atorvastatin (LIPITOR) 20 MG tablet Take 40 mg by mouth daily.   01/18/2021   carvedilol (COREG) 3.125 MG tablet Take 6.25 mg by mouth 2 (two) times daily.   01/18/2021   clonazePAM (KLONOPIN) 0.5 MG tablet Take 0.5 mg by mouth 2 (two) times daily as needed for anxiety.   Past Week   cyclobenzaprine (FLEXERIL) 10 MG tablet Take 10 mg by mouth 3 (three) times daily as needed for muscle spasms.   01/18/2021   diazepam (VALIUM) 5 MG tablet Take 5 mg by mouth every 12 (twelve) hours as needed.   Past Week   gabapentin (NEURONTIN) 300 MG capsule Take 600 mg by mouth 2 (two) times daily.    Past Week   omeprazole (PRILOSEC) 20 MG capsule Take 20 mg by mouth daily.   01/18/2021   ondansetron (ZOFRAN ODT) 4 MG disintegrating tablet Take 1 tablet (4 mg total) by mouth every 8 (eight) hours as needed for nausea or vomiting. 20  tablet 0 Past Week   pantoprazole (PROTONIX) 40 MG tablet Take 40 mg by mouth daily.   01/18/2021   albuterol (PROVENTIL HFA;VENTOLIN HFA) 108 (90 Base) MCG/ACT inhaler Inhale 2 puffs into the lungs every 4 (four) hours as needed for wheezing or shortness of breath.    at prn   albuterol (PROVENTIL) (2.5 MG/3ML) 0.083% nebulizer solution Take 2.5 mg by nebulization 3 (three) times daily.    at prn   ferrous sulfate 324 (65 Fe) MG TBEC Take 1 tablet by mouth daily. (Patient not taking: Reported on 01/19/2021)   Not Taking   FLUTICASONE PROPIONATE, INHAL, IN Inhale into the lungs daily.    at prn   SUMAtriptan (IMITREX) 100 MG tablet Take 100 mg by mouth every 2 (two) hours as needed for migraine. May repeat in 2 hours if headache persists or recurs.    at prn   warfarin (COUMADIN) 4 MG tablet Take 4 mg by mouth. Takes 2 tabs once a day on Thursday, Friday, Saturday, Sunday.   01/14/2021   warfarin (COUMADIN) 5 MG tablet Take 5 mg by mouth daily. Take 1 tablet daily on Monday, Tuesday, and Wednesday.   01/14/2021     Allergies  Allergen Reactions   Bactrim [Sulfamethoxazole-Trimethoprim] Rash   Tramadol Hives   Latex Rash     Past Medical History:  Diagnosis Date   Abnormal Pap smear of  cervix    Asthma    Dyspnea    GERD (gastroesophageal reflux disease)    Headache    Heart disease    cardial murmur   Hyperlipidemia    Hypertension    Mitral regurgitation    Trichomoniasis     Review of systems:  Otherwise negative.    Physical Exam  Gen: Alert, oriented. Appears stated age.  HEENT: PERRLA. Lungs: No respiratory distress CV: RRR Abd: soft, benign, no masses Ext: No edema    Planned procedures: Proceed with EGD/colonoscopy. The patient understands the nature of the planned procedure, indications, risks, alternatives and potential complications including but not limited to bleeding, infection, perforation, damage to internal organs and possible oversedation/side effects  from anesthesia. The patient agrees and gives consent to proceed.  Please refer to procedure notes for findings, recommendations and patient disposition/instructions.     Lesly Rubenstein, MD [facility] Gastroenterology

## 2021-01-19 NOTE — Op Note (Signed)
[facility] Patient Name: Anne Kirby Procedure Date: 01/19/2021 8:58 AM MRN: [id number] Account #: [id number] Date of Birth: 01-31-1981 Admit Type: Outpatient Age: 40 Room: [facility] ENDO ROOM 3 Gender: Female Note Status: Finalized Instrument Name: Jasper Riling [id number] Procedure:             Colonoscopy Indications:           Rectal bleeding, Change in bowel habits Providers:             Andrey Farmer MD, MD Medicines:             Monitored Anesthesia Care Complications:         No immediate complications. Estimated blood loss:                         Minimal. Procedure:             Pre-Anesthesia Assessment:                        - Prior to the procedure, a History and Physical was                         performed, and patient medications and allergies were                         reviewed. The patient is competent. The risks and                         benefits of the procedure and the sedation options and                         risks were discussed with the patient. All questions                         were answered and informed consent was obtained.                         Patient identification and proposed procedure were                         verified by the physician, the nurse, the anesthetist                         and the technician in the endoscopy suite. Mental                         Status Examination: alert and oriented. Airway                         Examination: normal oropharyngeal airway and neck                         mobility. Respiratory Examination: clear to                         auscultation. CV Examination: normal. Prophylactic                         Antibiotics: The patient does not require prophylactic  antibiotics. Prior Anticoagulants: The patient has                         taken coumadin 5 days prior to procedure and lovenox                         30 hours prior to procedure.  ASA Grade Assessment: III                         - A patient with severe systemic disease. After                         reviewing the risks and benefits, the patient was                         deemed in satisfactory condition to undergo the                         procedure. The anesthesia plan was to use monitored                         anesthesia care (MAC). Immediately prior to                         administration of medications, the patient was                         re-assessed for adequacy to receive sedatives. The                         heart rate, respiratory rate, oxygen saturations,                         blood pressure, adequacy of pulmonary ventilation, and                         response to care were monitored throughout the                         procedure. The physical status of the patient was                         re-assessed after the procedure.                        After obtaining informed consent, the colonoscope was                         passed under direct vision. Throughout the procedure,                         the patient's blood pressure, pulse, and oxygen                         saturations were monitored continuously. The                         Colonoscope was introduced through the anus and  advanced to the the terminal ileum. The colonoscopy                         was performed without difficulty. The patient                         tolerated the procedure well. The quality of the bowel                         preparation was good. Findings:      The perianal and digital rectal examinations were normal.      The terminal ileum appeared normal.      Two sessile polyps were found in the descending colon. The polyps were 1       to 2 mm in size. These polyps were removed with a jumbo cold forceps.       Resection and retrieval were complete. Estimated blood loss was minimal.      Normal mucosa was found in the entire  colon. Biopsies for histology were       taken with a cold forceps from the entire colon for evaluation of       microscopic colitis. Estimated blood loss was minimal.      Internal hemorrhoids were found during retroflexion. The hemorrhoids       were Grade I (internal hemorrhoids that do not prolapse).      The exam was otherwise without abnormality on direct and retroflexion       views. Impression:            - The examined portion of the ileum was normal.                        - Two 1 to 2 mm polyps in the descending colon,                         removed with a jumbo cold forceps. Resected and                         retrieved.                        - Normal mucosa in the entire examined colon. Biopsied.                        - Internal hemorrhoids.                        - The examination was otherwise normal on direct and                         retroflexion views. Recommendation:        - Discharge patient to home.                        - Resume previous diet.                        - Continue present medications.                        - Await pathology results.                        -  Repeat colonoscopy for surveillance based on                         pathology results.                        - Return to referring physician as previously                         scheduled.                        - Resume Coumadin (warfarin) today and Lovenox                         (enoxaparin) today at prior doses. Refer to managing                         physician for further adjustment of therapy. Procedure Code(s):     --- Professional ---                        [phone], Colonoscopy, flexible; with biopsy, single or                         multiple Diagnosis Code(s):     --- Professional ---                        K64.0, First degree hemorrhoids                        K63.5, Polyp of colon                        K62.5, Hemorrhage of anus and rectum                        R19.4,  Change in bowel habit CPT copyright 2019 [employer]. All rights reserved. The codes documented in this report are preliminary and upon coder review may  be revised to meet current compliance requirements. Andrey Farmer MD, MD 01/19/2021 10:11:28 AM Number of Addenda: 0 Note Initiated On: 01/19/2021 8:58 AM Scope Withdrawal Time: 0 hours 11 minutes 19 seconds  Total Procedure Duration: 0 hours 14 minutes 35 seconds  Estimated Blood Loss:  Estimated blood loss was minimal.      [facility]

## 2021-01-19 NOTE — Anesthesia Postprocedure Evaluation (Signed)
Anesthesia Post Note  Patient: Rafael Quesada Gum  Procedure(s) Performed: ESOPHAGOGASTRODUODENOSCOPY (EGD) WITH PROPOFOL COLONOSCOPY WITH PROPOFOL  Patient location during evaluation: Endoscopy Anesthesia Type: General Level of consciousness: awake and alert and oriented Pain management: pain level controlled Vital Signs Assessment: post-procedure vital signs reviewed and stable Respiratory status: spontaneous breathing, nonlabored ventilation and respiratory function stable Cardiovascular status: blood pressure returned to baseline and stable Postop Assessment: no signs of nausea or vomiting Anesthetic complications: no   No notable events documented.   Last Vitals:  Vitals:   01/19/21 1020 01/19/21 1030  BP: 130/84 127/79  Pulse:  71  Resp: 20 14  Temp:    SpO2: 100% 100%    Last Pain:  Vitals:   01/19/21 1030  TempSrc:   PainSc: 0-No pain                 Shantasia Hunnell

## 2021-01-19 NOTE — Anesthesia Procedure Notes (Signed)
Date/Time: 01/19/2021 9:42 AM Performed by: Vaughan Sine Pre-anesthesia Checklist: Patient identified, Emergency Drugs available, Suction available, Patient being monitored and Timeout performed Patient Re-evaluated:Patient Re-evaluated prior to induction Oxygen Delivery Method: Nasal cannula Preoxygenation: Pre-oxygenation with 100% oxygen Induction Type: IV induction Airway Equipment and Method: Bite block Placement Confirmation: positive ETCO2 and CO2 detector

## 2021-01-19 NOTE — Interval H&P Note (Signed)
History and Physical Interval Note:  01/19/2021 9:16 AM  Anne Kirby  has presented today for surgery, with the diagnosis of RECTAL BLEEDING,ABDOMINAL PAIN.  The various methods of treatment have been discussed with the patient and family. After consideration of risks, benefits and other options for treatment, the patient has consented to  Procedure(s) with comments: ESOPHAGOGASTRODUODENOSCOPY (EGD) WITH PROPOFOL (N/A) - PT/INR OFFICE SAYS PATIENT NEEDS ABX COLONOSCOPY WITH PROPOFOL (N/A) as a surgical intervention.  The patient's history has been reviewed, patient examined, no change in status, stable for surgery.  I have reviewed the patient's chart and labs.  Questions were answered to the patient's satisfaction.     Lesly Rubenstein  Ok to proceed with EGD/Colonoscopy

## 2021-01-19 NOTE — Progress Notes (Signed)
Pt requested blood work for PT/INR be drawn before she left since her paperwork from the office stated it needed to be drawn before procedure. MD made aware and placed orders. Pt was a difficult stick in the R arm and needed to have IV stopped and taken out for lab to draw blood in the L arm. Pt voiced displeasure with whole process. MD made aware. Pt to receive results via mychart.

## 2021-01-19 NOTE — Anesthesia Preprocedure Evaluation (Addendum)
Anesthesia Evaluation  Patient identified by MRN, date of birth, ID band Patient awake    Reviewed: Allergy & Precautions, NPO status , Patient's Chart, lab work & pertinent test results  History of Anesthesia Complications Negative for: history of anesthetic complications  Airway Mallampati: II  TM Distance: >3 FB Neck ROM: Full    Dental no notable dental hx.    Pulmonary asthma , Current Smoker and Patient abstained from smoking.,    breath sounds clear to auscultation- rhonchi (-) wheezing      Cardiovascular Exercise Tolerance: Good hypertension, Pt. on medications (-) CAD, (-) Past MI, (-) Cardiac Stents and (-) CABG + Valvular Problems/Murmurs (s/p MVR mechanical valve)  Rhythm:Regular Rate:Normal - Systolic murmurs and - Diastolic murmurs Echo 9/70/26: NORMAL LEFT VENTRICULAR SYSTOLIC FUNCTION  NORMAL RIGHT VENTRICULAR SYSTOLIC FUNCTION  TRIVIAL REGURGITATION NOTED  NO VALVULAR STENOSIS  TRIVIAL MR, TR  EF >55%     Neuro/Psych  Headaches, neg Seizures negative psych ROS   GI/Hepatic Neg liver ROS, GERD  ,  Endo/Other  negative endocrine ROS  Renal/GU negative Renal ROS     Musculoskeletal negative musculoskeletal ROS (+)   Abdominal (+) + obese,   Peds  Hematology negative hematology ROS (+)   Anesthesia Other Findings Past Medical History: No date: Abnormal Pap smear of cervix No date: Asthma No date: Dyspnea No date: GERD (gastroesophageal reflux disease) No date: Headache No date: Heart disease     Comment:  cardial murmur No date: Hyperlipidemia No date: Hypertension No date: Mitral regurgitation No date: Trichomoniasis   Reproductive/Obstetrics                            Anesthesia Physical Anesthesia Plan  ASA: 3  Anesthesia Plan: General   Post-op Pain Management:    Induction: Intravenous  PONV Risk Score and Plan: 1 and Propofol infusion  Airway  Management Planned: Natural Airway  Additional Equipment:   Intra-op Plan:   Post-operative Plan:   Informed Consent: I have reviewed the patients History and Physical, chart, labs and discussed the procedure including the risks, benefits and alternatives for the proposed anesthesia with the patient or authorized representative who has indicated his/her understanding and acceptance.     Dental advisory given  Plan Discussed with: CRNA and Anesthesiologist  Anesthesia Plan Comments:         Anesthesia Quick Evaluation

## 2021-01-20 ENCOUNTER — Encounter: Payer: Self-pay | Admitting: Gastroenterology

## 2021-01-20 LAB — SURGICAL PATHOLOGY

## 2021-06-24 ENCOUNTER — Other Ambulatory Visit: Payer: Self-pay

## 2021-06-24 ENCOUNTER — Emergency Department: Payer: Medicaid Other

## 2021-06-24 ENCOUNTER — Emergency Department
Admission: EM | Admit: 2021-06-24 | Discharge: 2021-06-24 | Disposition: A | Discharge status: Home / Self Care | Payer: Medicaid Other | Attending: Emergency Medicine | Admitting: Emergency Medicine

## 2021-06-24 ENCOUNTER — Encounter: Payer: Self-pay | Admitting: Emergency Medicine

## 2021-06-24 DIAGNOSIS — I10 Essential (primary) hypertension: Secondary | ICD-10-CM | POA: Insufficient documentation

## 2021-06-24 DIAGNOSIS — R791 Abnormal coagulation profile: Secondary | ICD-10-CM | POA: Diagnosis not present

## 2021-06-24 DIAGNOSIS — J45909 Unspecified asthma, uncomplicated: Secondary | ICD-10-CM | POA: Diagnosis not present

## 2021-06-24 DIAGNOSIS — R7309 Other abnormal glucose: Secondary | ICD-10-CM | POA: Insufficient documentation

## 2021-06-24 DIAGNOSIS — R8289 Other abnormal findings on cytological and histological examination of urine: Secondary | ICD-10-CM | POA: Insufficient documentation

## 2021-06-24 DIAGNOSIS — Z7901 Long term (current) use of anticoagulants: Secondary | ICD-10-CM | POA: Diagnosis not present

## 2021-06-24 DIAGNOSIS — R42 Dizziness and giddiness: Secondary | ICD-10-CM | POA: Insufficient documentation

## 2021-06-24 LAB — CBC
HCT: 42.5 % (ref 36.0–46.0)
Hemoglobin: 13.8 g/dL (ref 12.0–15.0)
MCH: 31.5 pg (ref 26.0–34.0)
MCHC: 32.5 g/dL (ref 30.0–36.0)
MCV: 97 fL (ref 80.0–100.0)
Platelets: 294 10*3/uL (ref 150–400)
RBC: 4.38 MIL/uL (ref 3.87–5.11)
RDW: 14.6 % (ref 11.5–15.5)
WBC: 9.7 10*3/uL (ref 4.0–10.5)
nRBC: 0 % (ref 0.0–0.2)

## 2021-06-24 LAB — BRAIN NATRIURETIC PEPTIDE: B Natriuretic Peptide: 141 pg/mL — ABNORMAL HIGH (ref 0.0–100.0)

## 2021-06-24 LAB — POC URINE PREG, ED: Preg Test, Ur: NEGATIVE

## 2021-06-24 LAB — URINALYSIS, ROUTINE W REFLEX MICROSCOPIC
Bilirubin Urine: NEGATIVE
Glucose, UA: NEGATIVE mg/dL
Ketones, ur: NEGATIVE mg/dL
Leukocytes,Ua: NEGATIVE
Nitrite: NEGATIVE
Protein, ur: NEGATIVE mg/dL
Specific Gravity, Urine: 1.02 (ref 1.005–1.030)
pH: 5 (ref 5.0–8.0)

## 2021-06-24 LAB — BASIC METABOLIC PANEL
Anion gap: 9 (ref 5–15)
BUN: 10 mg/dL (ref 6–20)
CO2: 21 mmol/L — ABNORMAL LOW (ref 22–32)
Calcium: 8.7 mg/dL — ABNORMAL LOW (ref 8.9–10.3)
Chloride: 106 mmol/L (ref 98–111)
Creatinine, Ser: 0.85 mg/dL (ref 0.44–1.00)
GFR, Estimated: >60 mL/min (ref >60)
Glucose, Bld: 223 mg/dL — ABNORMAL HIGH (ref 70–99)
Potassium: 3.7 mmol/L (ref 3.5–5.1)
Sodium: 136 mmol/L (ref 135–145)

## 2021-06-24 LAB — PROTIME-INR
INR: 3.8 — ABNORMAL HIGH (ref 0.8–1.2)
Prothrombin Time: 37.4 seconds — ABNORMAL HIGH (ref 11.4–15.2)

## 2021-06-24 LAB — HCG, QUANTITATIVE, PREGNANCY: hCG, Beta Chain, Quant, S: 1 m[IU]/mL (ref <5)

## 2021-06-24 IMAGING — MR MR HEAD W/O CM
12 series · 48 of 48 positions shown · non-contrast
Comparison: Head CT [DATE].

CLINICAL DATA: Provided history: Neuro deficit, acute, stroke
suspected. Additional history provided: Dizziness.

EXAM:
MRI HEAD WITHOUT CONTRAST
TECHNIQUE: Multiplanar, multiecho pulse sequences of the brain and surrounding
structures were obtained without intravenous contrast.

[Series 5: ax dwi_tracew · axial · 3.0mm · 0.65mm/px · z∈[-94,+61]mm · 3 of 48 slices shown]
[im 1/48]
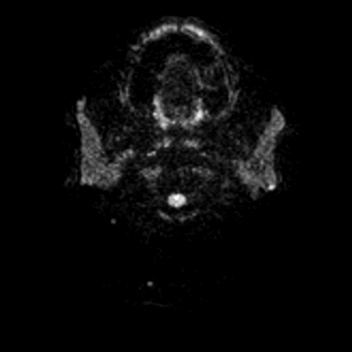
[im 24/48]
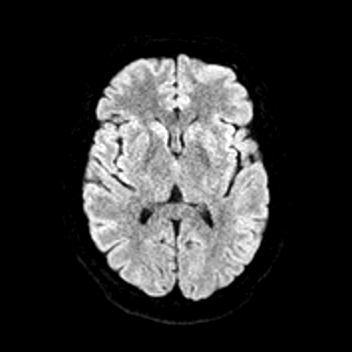
[im 48/48]
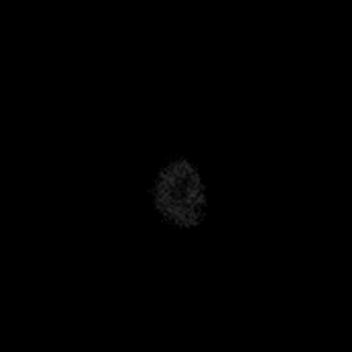

[Series 6: ax dwi_adc · axial · 3.0mm · 0.65mm/px · z∈[-94,+61]mm · 4 of 48 slices shown]
[im 1/48]
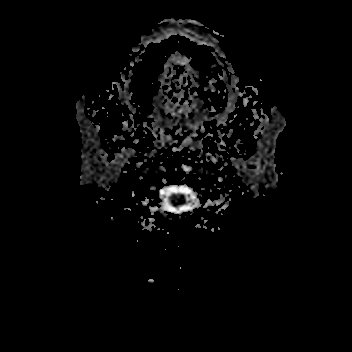
[im 16/48]
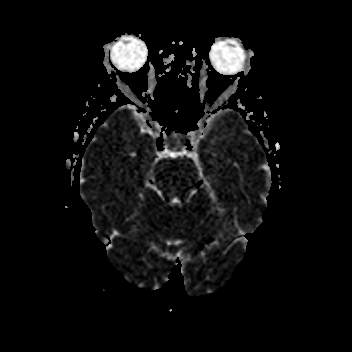
[im 32/48]
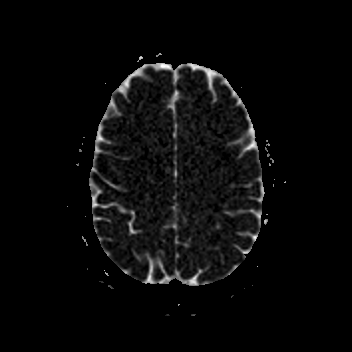
[im 48/48]
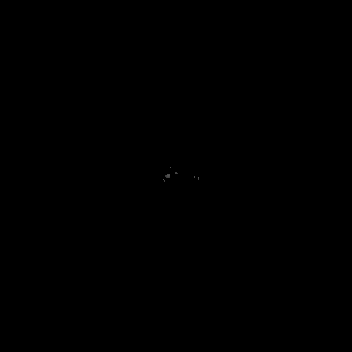

[Series 7: cor dwi_tracew · coronal · 5.0mm · 1.80mm/px · 3 of 38 slices shown]
[im 1/38]
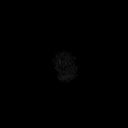
[im 19/38]
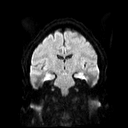
[im 38/38]
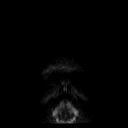

[Series 8: cor dwi_adc · coronal · 5.0mm · 1.80mm/px · 3 of 38 slices shown]
[im 1/38]
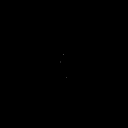
[im 19/38]
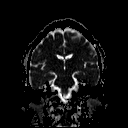
[im 38/38]
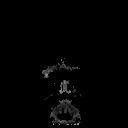

[Series 9: T1 · sagittal · 5.0mm · 0.94mm/px · 2 of 23 slices shown (1 of 2)]
[im 1/23]
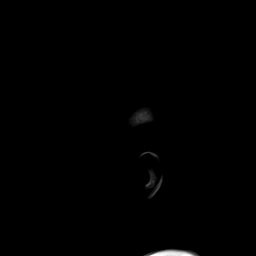
[im 23/23]
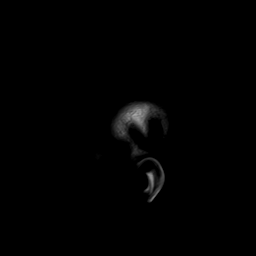

[Series 10: T2 · axial · 5.0mm · 0.53mm/px · z∈[-89,+55]mm · 2 of 25 slices shown (1 of 2)]
[im 1/25]
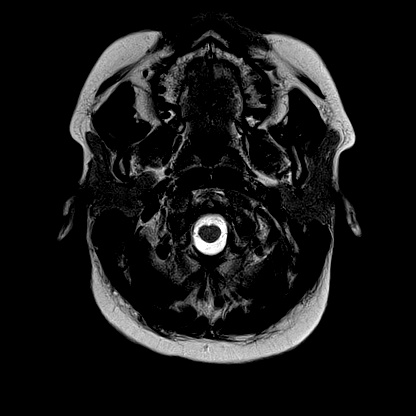
[im 25/25]
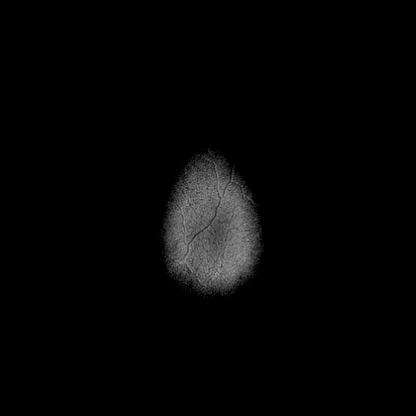

[Series 12: pha_images · axial · 3.0mm · 0.90mm/px · z∈[-106,+71]mm · 4 of 59 slices shown]
[im 1/59]
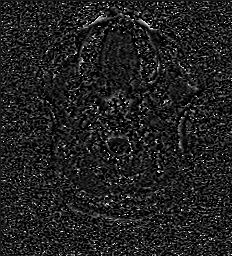
[im 20/59]
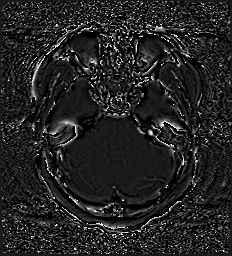
[im 39/59]
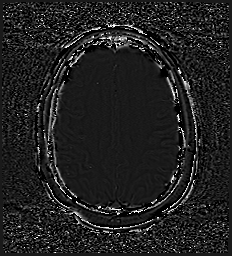
[im 59/59]
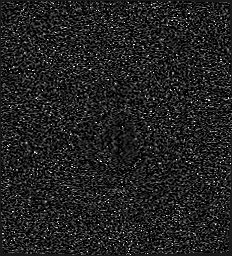

[Series 13: swi_images · axial · 3.0mm · 0.90mm/px · z∈[-106,+71]mm · 4 of 60 slices shown]
[im 1/60]
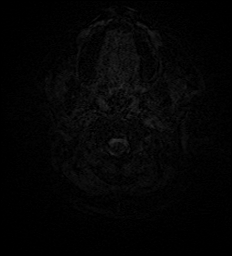
[im 20/60]
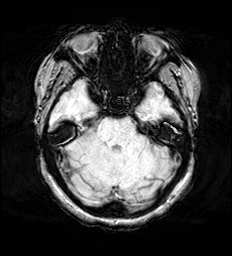
[im 40/60]
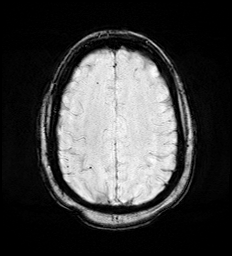
[im 60/60]
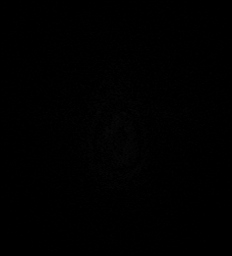

[Series 14: mip_images(sw) · axial · 24.0mm · 0.90mm/px · z∈[-95,+61]mm · 4 of 53 slices shown]
[im 1/53]
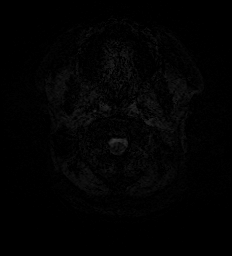
[im 18/53]
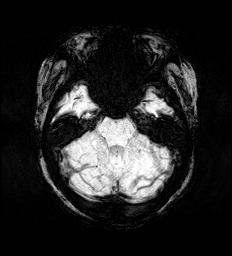
[im 35/53]
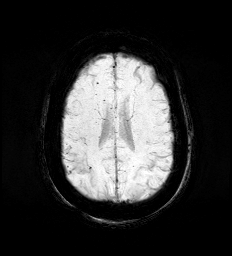
[im 53/53]
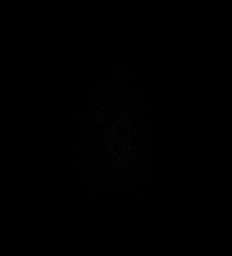

[Series 15: FLAIR · axial · 3.0mm · 0.53mm/px · z∈[-98,+64]mm · 4 of 55 slices shown]
[im 1/55]
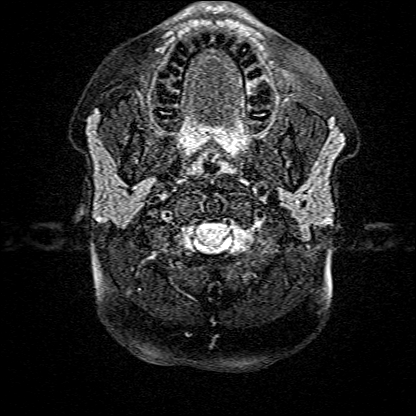
[im 19/55]
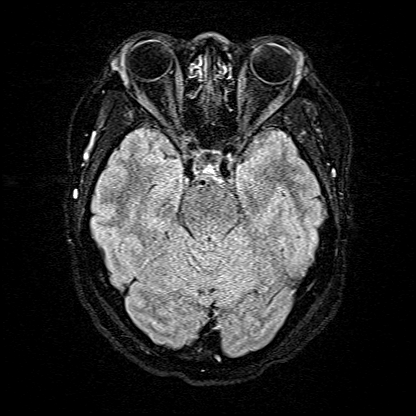
[im 37/55]
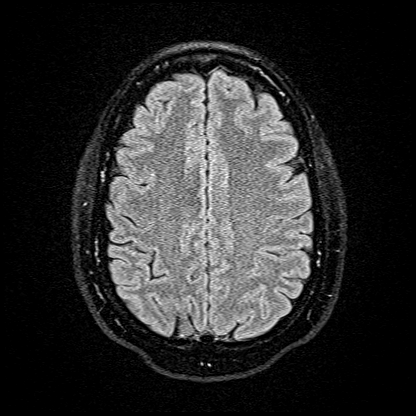
[im 55/55]
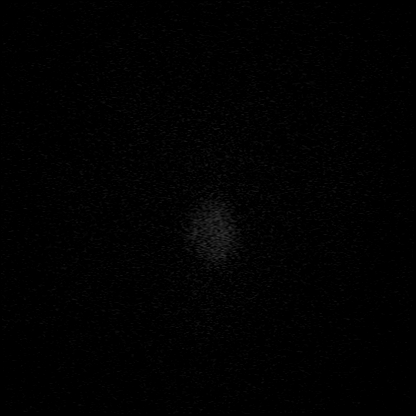

[Series 16: T1 · axial · 1.0mm · 0.98mm/px · z∈[-104,+71]mm · 13 of 176 slices shown (2 of 2)]
[im 1/176]
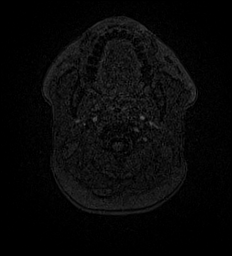
[im 15/176]
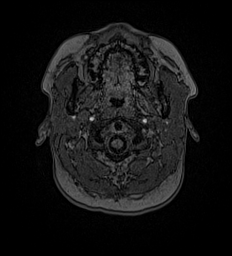
[im 30/176]
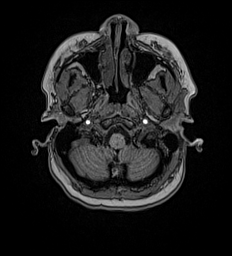
[im 44/176]
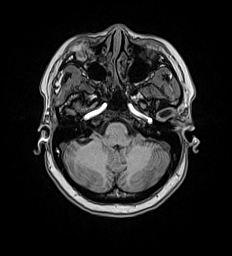
[im 59/176]
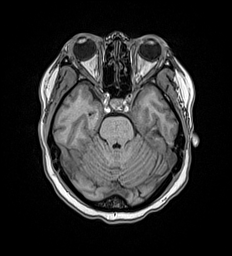
[im 73/176]
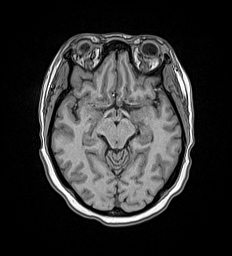
[im 88/176]
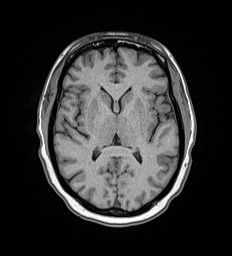
[im 103/176]
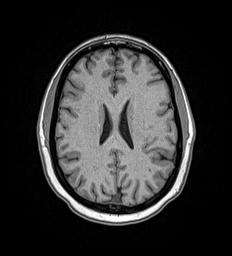
[im 117/176]
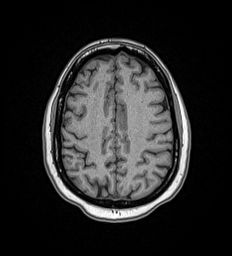
[im 132/176]
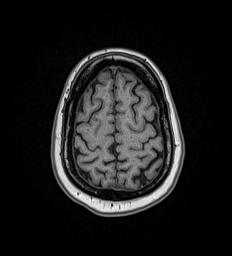
[im 146/176]
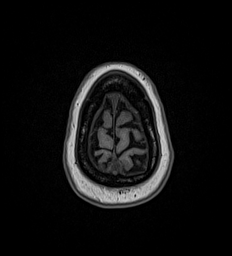
[im 161/176]
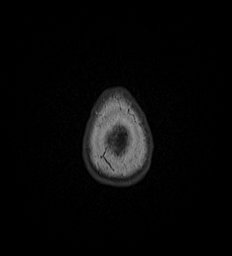
[im 176/176]
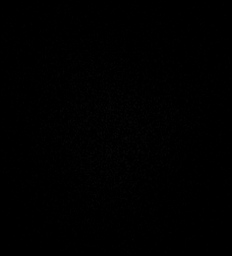

[Series 17: T2 · coronal · 5.0mm · 0.45mm/px · 2 of 31 slices shown (2 of 2)]
[im 1/31]
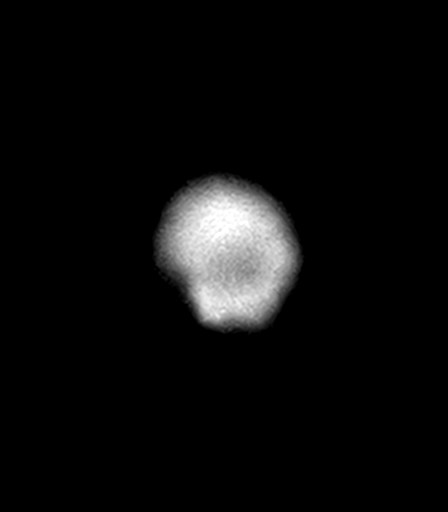
[im 31/31]
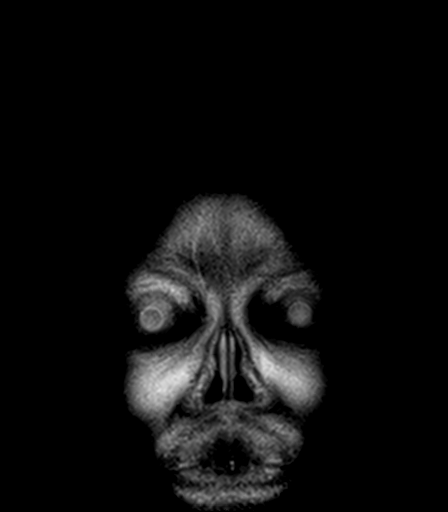

[48 of 48 positions shown; findings below may reference images not displayed]

FINDINGS: Brain:

Cerebral volume is normal.

There are a few nonspecific punctate chronic parenchymal
microhemorrhages scattered within the supratentorial and
infratentorial brain.

A tiny chronic infarct is questioned within the right cerebellar
hemisphere (series 10, image 6).

No cortical encephalomalacia is identified. No significant cerebral
white matter disease.

There is no acute infarct.

No evidence of an intracranial mass.

No extra-axial fluid collection.

No midline shift.

Vascular: Maintained flow voids within the proximal large arterial
vessels.

Skull and upper cervical spine: No focal suspicious marrow lesion.

Sinuses/Orbits: Visualized orbits show no acute finding. Small
mucous retention cyst within a posterior right ethmoid air cell.
Trace mucosal thickening scattered elsewhere within the bilateral
ethmoid sinuses.
IMPRESSION: No evidence of acute intracranial abnormality.

There are a few nonspecific punctate chronic parenchymal
microhemorrhages scattered within the supratentorial and
infratentorial brain.

A tiny chronic infarct is questioned within the right cerebellar
hemisphere.

Mild bilateral ethmoid sinusitis.

## 2021-06-24 MED ORDER — MECLIZINE HCL 25 MG PO TABS
25.0000 mg | ORAL_TABLET | Freq: Once | ORAL | Status: AC
  Administered 2021-06-24: 25 mg via ORAL
  Filled 2021-06-24: qty 1

## 2021-06-24 MED ORDER — MECLIZINE HCL 25 MG PO TABS
25.0000 mg | ORAL_TABLET | Freq: Three times a day (TID) | ORAL | 0 refills | Status: AC | PRN
Start: 2021-06-24 — End: 2021-07-04

## 2021-06-24 NOTE — ED Notes (Addendum)
Pt NAD in bed, a/ox4. Pt c/o feeling like the room is spinning, x 2 days with it worsening while on her way to [place] [facility] this morning. Denies headache, blurred vision, n/v ?

## 2021-06-24 NOTE — ED Triage Notes (Signed)
Pt comes into the ED via POV c/o dizziness that has been intermittently going on.  Pt states she was on her way to get blood drawn and the dizziness got worse.  PT in NAD at this time and denies any unilateral weakness.  Speech is clear and gait is steady.  Pt has even and unlabored respirations. ?

## 2021-06-24 NOTE — ED Notes (Signed)
Pt NAD, a/ox4. Pt verbalizes understanding of all DC and f/u instructions. All questions answered. Pt walks with steady gait to lobby at DC.  ? ?

## 2021-06-24 NOTE — ED Provider Notes (Signed)
? ?[facility] ?Provider Note ? ? ? Event Date/Time  ? First MD Initiated Contact with Patient 06/24/21 1025   ?  (approximate) ? ? ?History  ? ?Dizziness ? ? ?HPI ? ?Anne Kirby is a 41 y.o. 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  Patient states he felt little bit lightheaded over the last 2 or 3 days and has been drinking fluids to try to stay hydrated but feels that today while she was driving she felt "drunk".  She notes that on Sunday she finished a 7-day course of steroids which were prescribed for "fluid in my ear".  She states she feels a little better now than when it began but states her vision seemed a little off with her depth perception.  She denies any headache, earache, sore throat, fevers, cough, chest pain, abdominal pain, nausea, vomiting, diarrhea, rash or falls or injuries.  She denies any recent tobacco abuse, EtOH use or illicit drug use.  She is compliant with all her medications.  She does note she was on her way to get her INR checked when this happened.  She also notes that couple weeks ago she had fairly significant sinus infection but that feels that she is much better as far as her congestion and cough. ? ? ? ?Past Medical History:  ?Diagnosis Date  ? Abnormal Pap smear of cervix   ? Asthma   ? Dyspnea   ? GERD (gastroesophageal reflux disease)   ? Headache   ? Heart disease   ? cardial murmur  ? Hyperlipidemia   ? Hypertension   ? Mitral regurgitation   ? Trichomoniasis   ? ? ? ?  ? ? ?Physical Exam  ?Triage Vital Signs: ?ED Triage Vitals [06/24/21 1022]  ?Enc Vitals Group  ?   BP   ?   Pulse   ?   Resp   ?   Temp   ?   Temp src   ?   SpO2   ?   Weight 200 lb 6.4 oz (90.9 kg)  ?   Height '5\' 7"'$  (1.702 m)  ?   Head Circumference   ?   Peak Flow   ?   Pain Score 0  ?   Pain Loc   ?   Pain Edu?   ?   Excl. in  [place]?   ? ? ?Most recent vital signs: ?Vitals:  ? 06/24/21 1100 06/24/21 1300  ?BP: 121/89   ?Pulse: 93 86  ?Resp: (!) 26 15  ?Temp:    ?SpO2: 99% 99%  ? ? ?General: Awake, no distress.  ?CV:  Good peripheral perfusion.  2+ radial pulses. ?Resp:  Normal effort.  Clear bilaterally. ?Abd:  No distention.  Systolic murmur. ?Other:  No pronator drift.  No finger dysmetria.  PERRLA.  EOMI.  Cranial nerves II to XII appear grossly intact.  Patient is symmetric strength in all extremities.  She does a little unsteady on trial of ambulation.  TMs are unremarkable bilaterally.  Oropharynx is unremarkable.  Patient has full range of motion of her neck.  Visual acuity is 20/30 bilaterally. ? ? ?ED Results / Procedures / Treatments  ?Labs ?(all labs ordered are listed, but only abnormal results are displayed) ?Labs Reviewed  ?BASIC METABOLIC PANEL - Abnormal; Notable for the following components:  ?  Result Value  ? CO2 21 (*)   ? Glucose, Bld 223 (*)   ? Calcium 8.7 (*)   ? All other components within normal limits  ?URINALYSIS, ROUTINE W REFLEX MICROSCOPIC - Abnormal; Notable for the following components:  ? Color, Urine YELLOW (*)   ? APPearance HAZY (*)   ? Hgb urine dipstick MODERATE (*)   ? Bacteria, UA MANY (*)   ? All other components within normal limits  ?PROTIME-INR - Abnormal; Notable for the following components:  ? Prothrombin Time 37.4 (*)   ? INR 3.8 (*)   ? All other components within normal limits  ?BRAIN NATRIURETIC PEPTIDE - Abnormal; Notable for the following components:  ? B Natriuretic Peptide 141.0 (*)   ? All other components within normal limits  ?CBC  ?HCG, QUANTITATIVE, PREGNANCY  ?HEMOGLOBIN A1C  ?POC URINE PREG, ED  ?CBG MONITORING, ED  ? ? ? ?EKG ? ?EKG is remarkable for sinus rhythm with a ventricular rate of 95, normal axis, unremarkable intervals without evidence of acute ischemia or significant arrhythmia. ? ? ?RADIOLOGY ? ?MRI brain on my interpretation without evidence of acute hemorrhage,  ischemia mass effect or edema.  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 ? ? ?PROCEDURES: ? ?Critical Care performed: No ? ?.1-3 Lead EKG Interpretation ?Performed by: Lucrezia Starch, MD ?Authorized by: Lucrezia Starch, MD  ? ?  Interpretation: normal   ?  ECG rate assessment: normal   ?  Rhythm: sinus rhythm   ?  Ectopy: none   ?  Conduction: normal   ? ?The patient is on the cardiac monitor to evaluate for evidence of arrhythmia and/or significant heart rate changes. ? ? ?MEDICATIONS ORDERED IN ED: ?Medications  ?meclizine (ANTIVERT) tablet 25 mg (25 mg Oral Given 06/24/21 1211)  ? ? ? ?IMPRESSION / MDM / ASSESSMENT AND PLAN / ED COURSE  ?I reviewed the triage vital signs and the nursing notes. ?             ?               ? ?Differential diagnosis includes, but is not limited to arrhythmia, CVA, peripheral vertigo, peripheral vertigo etiologies such as millimeters or labyrinthitis patient states he has had pressure in ears on and off for the last couple of weeks as well as possible anemia or metabolic derangements.  There is no obvious fluid level on my exam of TMs or clear signs of infection in the head or neck. ? ?ECG is unremarkable.  BMP is remarkable for glucose of 223.  Patient has no other significant electrolyte metabolic derangements.  She says she is no history of diabetes and I suspect this may be from recent steroid use we will send A1c which I advised patient she can follow-up with her PCP.  CBC shows no leukocytosis or acute anemia.  UA has some hemoglobin patient states he just came from.  But no ketones or glucose.  There is some bacteria but no other evidence of infection and patient has no urinary  symptoms to suggest a cystitis.  Pregnancy test is negative.  INR 3.8. ? ?Patient states he will discuss this with her PCP to see if she needs to hold her Coumadin which she typically  does when it is over 3.5. ? ?On reassessment she is feeling much better after the meclizine. ? ?MRI brain on my interpretation without evidence of acute hemorrhage, ischemia mass effect or edema.  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 ? ?I discussed these incidental findings with on-call neurologist Dr. Curly Shores reviewed MRI findings and recommended no emergent or inpatient work-up but the patient can follow-up with her neurologist outpatient and that they microhemorrhages without unexpected 7 on chronic anticoagulation.  I will have patient follow-up with neurology for this.  I have a low suspicion for acute infarct or other main life-threatening process and given nonfocal exam with patient now able to ambulate with steady gait unassisted after the meclizine I think she is stable for discharge with outpatient PCP and neurology follow-up.  She will talk to her PCP later today about when to resume her Coumadin and discussed returning for any new or worsening of symptoms.  Discharged in stable condition.  Strict return precautions advised and discussed.   ? ? ?FINAL CLINICAL IMPRESSION(S) / ED DIAGNOSES  ? ?Final diagnoses:  ?Dizziness  ?Elevated INR  ? ? ? ?Rx / DC Orders  ? ?ED Discharge Orders   ? ?      Ordered  ?  meclizine (ANTIVERT) 25 MG tablet  3 times daily PRN       ? 06/24/21 1423  ? ?  ?  ? ?  ? ? ? ?Note:  This document was prepared using Dragon voice recognition software and may include unintentional dictation errors. ?   ?Lucrezia Starch, MD ?06/24/21 1426 ? ?

## 2021-06-24 NOTE — Discharge Instructions (Addendum)
Your MRI today showed: ?IMPRESSION: ?No evidence of acute intracranial abnormality. ?  ?There are a few nonspecific punctate chronic parenchymal ?microhemorrhages scattered within the supratentorial and ?infratentorial brain. ?  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 ?  ?Mild bilateral ethmoid sinusitis. ?

## 2021-06-24 NOTE — ED Notes (Signed)
Pt to MRI

## 2021-07-16 ENCOUNTER — Encounter: Payer: Self-pay | Admitting: Otolaryngology

## 2022-07-06 ENCOUNTER — Other Ambulatory Visit: Payer: Self-pay

## 2022-07-06 ENCOUNTER — Emergency Department (HOSPITAL_COMMUNITY): Payer: Medicaid Other

## 2022-07-06 ENCOUNTER — Inpatient Hospital Stay (HOSPITAL_COMMUNITY)
Admission: EM | Admit: 2022-07-06 | Discharge: 2022-07-08 | DRG: 392 | Disposition: A | Discharge status: Home / Self Care | Payer: Medicaid Other | Attending: Family Medicine | Admitting: Family Medicine

## 2022-07-06 ENCOUNTER — Encounter (HOSPITAL_COMMUNITY): Payer: Self-pay | Admitting: Emergency Medicine

## 2022-07-06 DIAGNOSIS — R0609 Other forms of dyspnea: Secondary | ICD-10-CM | POA: Diagnosis present

## 2022-07-06 DIAGNOSIS — Z882 Allergy status to sulfonamides status: Secondary | ICD-10-CM

## 2022-07-06 DIAGNOSIS — I10 Essential (primary) hypertension: Secondary | ICD-10-CM | POA: Diagnosis present

## 2022-07-06 DIAGNOSIS — E782 Mixed hyperlipidemia: Secondary | ICD-10-CM | POA: Diagnosis present

## 2022-07-06 DIAGNOSIS — R1011 Right upper quadrant pain: Secondary | ICD-10-CM

## 2022-07-06 DIAGNOSIS — I4891 Unspecified atrial fibrillation: Secondary | ICD-10-CM | POA: Diagnosis present

## 2022-07-06 DIAGNOSIS — Z91040 Latex allergy status: Secondary | ICD-10-CM

## 2022-07-06 DIAGNOSIS — R109 Unspecified abdominal pain: Secondary | ICD-10-CM | POA: Diagnosis present

## 2022-07-06 DIAGNOSIS — Z823 Family history of stroke: Secondary | ICD-10-CM

## 2022-07-06 DIAGNOSIS — Z7982 Long term (current) use of aspirin: Secondary | ICD-10-CM

## 2022-07-06 DIAGNOSIS — K529 Noninfective gastroenteritis and colitis, unspecified: Secondary | ICD-10-CM | POA: Diagnosis not present

## 2022-07-06 DIAGNOSIS — D72829 Elevated white blood cell count, unspecified: Secondary | ICD-10-CM | POA: Diagnosis not present

## 2022-07-06 DIAGNOSIS — R112 Nausea with vomiting, unspecified: Secondary | ICD-10-CM | POA: Diagnosis present

## 2022-07-06 DIAGNOSIS — Z79899 Other long term (current) drug therapy: Secondary | ICD-10-CM

## 2022-07-06 DIAGNOSIS — Z954 Presence of other heart-valve replacement: Secondary | ICD-10-CM

## 2022-07-06 DIAGNOSIS — R739 Hyperglycemia, unspecified: Secondary | ICD-10-CM | POA: Diagnosis present

## 2022-07-06 DIAGNOSIS — Z952 Presence of prosthetic heart valve: Secondary | ICD-10-CM

## 2022-07-06 DIAGNOSIS — F17210 Nicotine dependence, cigarettes, uncomplicated: Secondary | ICD-10-CM | POA: Diagnosis present

## 2022-07-06 DIAGNOSIS — Z8249 Family history of ischemic heart disease and other diseases of the circulatory system: Secondary | ICD-10-CM

## 2022-07-06 DIAGNOSIS — E876 Hypokalemia: Secondary | ICD-10-CM | POA: Diagnosis present

## 2022-07-06 DIAGNOSIS — Z720 Tobacco use: Secondary | ICD-10-CM | POA: Diagnosis present

## 2022-07-06 DIAGNOSIS — K219 Gastro-esophageal reflux disease without esophagitis: Secondary | ICD-10-CM | POA: Diagnosis present

## 2022-07-06 DIAGNOSIS — F419 Anxiety disorder, unspecified: Secondary | ICD-10-CM | POA: Diagnosis present

## 2022-07-06 DIAGNOSIS — I480 Paroxysmal atrial fibrillation: Secondary | ICD-10-CM | POA: Diagnosis present

## 2022-07-06 DIAGNOSIS — D509 Iron deficiency anemia, unspecified: Secondary | ICD-10-CM | POA: Diagnosis present

## 2022-07-06 DIAGNOSIS — J309 Allergic rhinitis, unspecified: Secondary | ICD-10-CM | POA: Diagnosis present

## 2022-07-06 DIAGNOSIS — Z885 Allergy status to narcotic agent status: Secondary | ICD-10-CM

## 2022-07-06 DIAGNOSIS — E860 Dehydration: Secondary | ICD-10-CM | POA: Diagnosis present

## 2022-07-06 DIAGNOSIS — Z7901 Long term (current) use of anticoagulants: Secondary | ICD-10-CM

## 2022-07-06 DIAGNOSIS — G43909 Migraine, unspecified, not intractable, without status migrainosus: Secondary | ICD-10-CM | POA: Diagnosis present

## 2022-07-06 DIAGNOSIS — Z833 Family history of diabetes mellitus: Secondary | ICD-10-CM

## 2022-07-06 LAB — CBC WITH DIFFERENTIAL/PLATELET
Abs Immature Granulocytes: 0.05 10*3/uL (ref 0.00–0.07)
Basophils Absolute: 0 10*3/uL (ref 0.0–0.1)
Basophils Relative: 0 %
Eosinophils Absolute: 0 10*3/uL (ref 0.0–0.5)
Eosinophils Relative: 0 %
HCT: 44.9 % (ref 36.0–46.0)
Hemoglobin: 15 g/dL (ref 12.0–15.0)
Immature Granulocytes: 0 %
Lymphocytes Relative: 14 %
Lymphs Abs: 2.1 10*3/uL (ref 0.7–4.0)
MCH: 31.4 pg (ref 26.0–34.0)
MCHC: 33.4 g/dL (ref 30.0–36.0)
MCV: 93.9 fL (ref 80.0–100.0)
Monocytes Absolute: 1.1 10*3/uL — ABNORMAL HIGH (ref 0.1–1.0)
Monocytes Relative: 7 %
Neutro Abs: 12.1 10*3/uL — ABNORMAL HIGH (ref 1.7–7.7)
Neutrophils Relative %: 79 %
Platelets: 384 10*3/uL (ref 150–400)
RBC: 4.78 MIL/uL (ref 3.87–5.11)
RDW: 14.6 % (ref 11.5–15.5)
WBC: 15.3 10*3/uL — ABNORMAL HIGH (ref 4.0–10.5)
nRBC: 0 % (ref 0.0–0.2)

## 2022-07-06 LAB — COMPREHENSIVE METABOLIC PANEL
ALT: 24 U/L (ref 0–44)
AST: 28 U/L (ref 15–41)
Albumin: 4.8 g/dL (ref 3.5–5.0)
Alkaline Phosphatase: 98 U/L (ref 38–126)
Anion gap: 11 (ref 5–15)
BUN: 13 mg/dL (ref 6–20)
CO2: 26 mmol/L (ref 22–32)
Calcium: 10.1 mg/dL (ref 8.9–10.3)
Chloride: 100 mmol/L (ref 98–111)
Creatinine, Ser: 1 mg/dL (ref 0.44–1.00)
GFR, Estimated: >60 mL/min (ref >60)
Glucose, Bld: 132 mg/dL — ABNORMAL HIGH (ref 70–99)
Potassium: 3.2 mmol/L — ABNORMAL LOW (ref 3.5–5.1)
Sodium: 137 mmol/L (ref 135–145)
Total Bilirubin: 0.8 mg/dL (ref 0.3–1.2)
Total Protein: 8.8 g/dL — ABNORMAL HIGH (ref 6.5–8.1)

## 2022-07-06 LAB — LIPASE, BLOOD: Lipase: 24 U/L (ref 11–51)

## 2022-07-06 LAB — PROTIME-INR
INR: 2.1 — ABNORMAL HIGH (ref 0.8–1.2)
Prothrombin Time: 23.7 seconds — ABNORMAL HIGH (ref 11.4–15.2)

## 2022-07-06 LAB — ETHANOL: Alcohol, Ethyl (B): <10 mg/dL (ref <10)

## 2022-07-06 LAB — HIV ANTIBODY (ROUTINE TESTING W REFLEX): HIV Screen 4th Generation wRfx: NONREACTIVE

## 2022-07-06 LAB — MAGNESIUM: Magnesium: 2 mg/dL (ref 1.7–2.4)

## 2022-07-06 MED ORDER — OXYCODONE HCL 5 MG PO TABS
5.0000 mg | ORAL_TABLET | ORAL | Status: DC | PRN
  Administered 2022-07-06 (×2): 5 mg via ORAL
  Filled 2022-07-06 (×2): qty 1

## 2022-07-06 MED ORDER — LABETALOL HCL 5 MG/ML IV SOLN
10.0000 mg | Freq: Once | INTRAVENOUS | Status: AC
  Administered 2022-07-06: 10 mg via INTRAVENOUS
  Filled 2022-07-06: qty 4

## 2022-07-06 MED ORDER — POTASSIUM CHLORIDE 10 MEQ/100ML IV SOLN
10.0000 meq | INTRAVENOUS | Status: AC
  Administered 2022-07-06 (×2): 10 meq via INTRAVENOUS
  Filled 2022-07-06: qty 100

## 2022-07-06 MED ORDER — CIPROFLOXACIN IN D5W 400 MG/200ML IV SOLN
400.0000 mg | Freq: Two times a day (BID) | INTRAVENOUS | Status: DC
  Administered 2022-07-07: 400 mg via INTRAVENOUS
  Filled 2022-07-06: qty 200

## 2022-07-06 MED ORDER — POTASSIUM CHLORIDE IN NACL 20-0.9 MEQ/L-% IV SOLN
INTRAVENOUS | Status: DC
  Filled 2022-07-06: qty 1000

## 2022-07-06 MED ORDER — ACETAMINOPHEN 650 MG RE SUPP: 650.0000 mg | Freq: Four times a day (QID) | RECTAL | Status: DC | PRN

## 2022-07-06 MED ORDER — ONDANSETRON HCL 4 MG/2ML IJ SOLN: 4.0000 mg | Freq: Four times a day (QID) | INTRAMUSCULAR | Status: DC | PRN

## 2022-07-06 MED ORDER — ENOXAPARIN SODIUM 100 MG/ML IJ SOSY
90.0000 mg | PREFILLED_SYRINGE | Freq: Two times a day (BID) | INTRAMUSCULAR | Status: DC
  Administered 2022-07-06 – 2022-07-07 (×2): 90 mg via SUBCUTANEOUS
  Filled 2022-07-06 (×2): qty 1

## 2022-07-06 MED ORDER — HYDRALAZINE HCL 20 MG/ML IJ SOLN
10.0000 mg | INTRAMUSCULAR | Status: DC | PRN
  Administered 2022-07-06 (×2): 10 mg via INTRAVENOUS
  Filled 2022-07-06 (×2): qty 1

## 2022-07-06 MED ORDER — BISACODYL 5 MG PO TBEC: 5.0000 mg | DELAYED_RELEASE_TABLET | Freq: Every day | ORAL | Status: DC | PRN

## 2022-07-06 MED ORDER — METRONIDAZOLE 500 MG/100ML IV SOLN
500.0000 mg | Freq: Once | INTRAVENOUS | Status: AC
  Administered 2022-07-06: 500 mg via INTRAVENOUS
  Filled 2022-07-06: qty 100

## 2022-07-06 MED ORDER — FAMOTIDINE IN NACL 20-0.9 MG/50ML-% IV SOLN
20.0000 mg | Freq: Once | INTRAVENOUS | Status: AC
  Administered 2022-07-06: 20 mg via INTRAVENOUS
  Filled 2022-07-06: qty 50

## 2022-07-06 MED ORDER — PROCHLORPERAZINE EDISYLATE 10 MG/2ML IJ SOLN: 10.0000 mg | INTRAMUSCULAR | Status: DC | PRN

## 2022-07-06 MED ORDER — ONDANSETRON HCL 4 MG PO TABS: 4.0000 mg | ORAL_TABLET | Freq: Four times a day (QID) | ORAL | Status: DC | PRN

## 2022-07-06 MED ORDER — FENTANYL CITRATE PF 50 MCG/ML IJ SOSY: 25.0000 ug | PREFILLED_SYRINGE | INTRAMUSCULAR | Status: DC | PRN

## 2022-07-06 MED ORDER — METOPROLOL TARTRATE 5 MG/5ML IV SOLN
5.0000 mg | Freq: Four times a day (QID) | INTRAVENOUS | Status: DC
  Administered 2022-07-06 – 2022-07-08 (×6): 5 mg via INTRAVENOUS
  Filled 2022-07-06 (×8): qty 5

## 2022-07-06 MED ORDER — POTASSIUM CHLORIDE IN NACL 20-0.9 MEQ/L-% IV SOLN: Freq: Once | INTRAVENOUS | Status: DC

## 2022-07-06 MED ORDER — ACETAMINOPHEN 325 MG PO TABS
650.0000 mg | ORAL_TABLET | Freq: Four times a day (QID) | ORAL | Status: DC | PRN
  Administered 2022-07-07 (×2): 650 mg via ORAL
  Filled 2022-07-06 (×2): qty 2

## 2022-07-06 MED ORDER — ALBUTEROL SULFATE (2.5 MG/3ML) 0.083% IN NEBU: 3.0000 mL | INHALATION_SOLUTION | RESPIRATORY_TRACT | Status: DC | PRN

## 2022-07-06 MED ORDER — PANTOPRAZOLE SODIUM 40 MG IV SOLR
40.0000 mg | INTRAVENOUS | Status: DC
  Administered 2022-07-06 – 2022-07-07 (×2): 40 mg via INTRAVENOUS
  Filled 2022-07-06 (×2): qty 10

## 2022-07-06 MED ORDER — LORAZEPAM 2 MG/ML IJ SOLN
1.0000 mg | Freq: Once | INTRAMUSCULAR | Status: AC
  Administered 2022-07-06: 1 mg via INTRAVENOUS
  Filled 2022-07-06: qty 1

## 2022-07-06 MED ORDER — ALBUTEROL SULFATE (2.5 MG/3ML) 0.083% IN NEBU
2.5000 mg | INHALATION_SOLUTION | Freq: Three times a day (TID) | RESPIRATORY_TRACT | Status: DC
  Administered 2022-07-07 – 2022-07-08 (×4): 2.5 mg via RESPIRATORY_TRACT
  Filled 2022-07-06 (×5): qty 3

## 2022-07-06 MED ORDER — FLUTICASONE PROPIONATE (INHAL) 50 MCG/ACT IN AEPB: INHALATION_SPRAY | Freq: Every day | RESPIRATORY_TRACT | Status: DC

## 2022-07-06 MED ORDER — CIPROFLOXACIN IN D5W 400 MG/200ML IV SOLN
400.0000 mg | Freq: Once | INTRAVENOUS | Status: AC
  Administered 2022-07-06: 400 mg via INTRAVENOUS
  Filled 2022-07-06: qty 200

## 2022-07-06 MED ORDER — METOPROLOL TARTRATE 5 MG/5ML IV SOLN: 5.0000 mg | Freq: Four times a day (QID) | INTRAVENOUS | Status: DC

## 2022-07-06 MED ORDER — BUDESONIDE 0.25 MG/2ML IN SUSP
0.2500 mg | Freq: Two times a day (BID) | RESPIRATORY_TRACT | Status: DC
  Administered 2022-07-06 – 2022-07-08 (×4): 0.25 mg via RESPIRATORY_TRACT
  Filled 2022-07-06 (×4): qty 2

## 2022-07-06 MED ORDER — METRONIDAZOLE 500 MG/100ML IV SOLN
500.0000 mg | Freq: Two times a day (BID) | INTRAVENOUS | Status: DC
  Administered 2022-07-07 (×3): 500 mg via INTRAVENOUS
  Filled 2022-07-06 (×3): qty 100

## 2022-07-06 MED ORDER — IOHEXOL 300 MG/ML  SOLN
100.0000 mL | Freq: Once | INTRAMUSCULAR | Status: AC | PRN
  Administered 2022-07-06: 100 mL via INTRAVENOUS

## 2022-07-06 NOTE — Progress Notes (Signed)
ANTICOAGULATION CONSULT NOTE - Initial Consult  Pharmacy Consult for lovenox Indication: mechanical mitral valve  Allergies  Allergen Reactions   Bactrim [Sulfamethoxazole-Trimethoprim] Rash   Tramadol Hives   Latex Rash    Patient Measurements: Height:  (170.2 cm) Weight: 88.5 kg (195 lb) IBW/kg (Calculated) : 61.6 Heparin Dosing Weight:   Vital Signs: Temp: 98.3 F (36.8 C) (04/16 1117) Temp Source: Oral (04/16 1117) BP: 164/95 (04/16 1200) Pulse Rate: 83 (04/16 1200)  Labs: Recent Labs    07/06/22 1134  HGB 15.0  HCT 44.9  PLT 384  LABPROT 23.7*  INR 2.1*  CREATININE 1.00    Estimated Creatinine Clearance: 82.1 mL/min (by C-G formula based on SCr of 1 mg/dL).   Medical History: Past Medical History:  Diagnosis Date   Abnormal Pap smear of cervix    Asthma    Dyspnea    GERD (gastroesophageal reflux disease)    Headache    Heart disease    cardial murmur   Hyperlipidemia    Hypertension    Mitral regurgitation    Trichomoniasis     Medications:  (Not in a hospital admission)   Assessment: Pharmacy consulted to dose lovenox in patient with mechanical mitral valve who is unable to take PTA warfarin due to enteritis. Patient's INR on admission is subtherapeutic at 2.1 (2.5-3.5 goal).   CBC WNL INR 2.1  Goal of Therapy:  Heparin level 0.3-0.7 units/ml Monitor platelets by anticoagulation protocol: Yes   Plan:  Lovenox 90 mg subq every 12 hours. Monitor H&H and s/s of bleeding.  Judeth Cornfield, PharmD Clinical Pharmacist 07/06/2022 3:20 PM

## 2022-07-06 NOTE — ED Provider Notes (Signed)
[place] [facility] AT [facility] Provider Note   CSN: [id number] Arrival date & time: 07/06/22  4098     History  Chief Complaint  Patient presents with   Emesis    Anne Kirby is a 42 y.o. female.  HPI Patient presents with her daughter who assists with the history.  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  She is on Coumadin.  She presents today due to ongoing nausea, vomiting, abdominal pain.  She has been seen and evaluated at 2 other ED's over the past 5 days during this illness, presents due to persistent symptoms, p.o. intolerance, including inability to take her Coumadin. No fever, no confusion, no diarrhea.     Home Medications Prior to Admission medications   Medication Sig Start Date End Date Taking? Authorizing Provider  albuterol (PROVENTIL HFA;VENTOLIN HFA) 108 (90 Base) MCG/ACT inhaler Inhale 2 puffs into the lungs every 4 (four) hours as needed for wheezing or shortness of breath.    [provider]  albuterol (PROVENTIL) (2.5 MG/3ML) 0.083% nebulizer solution Take 2.5 mg by nebulization 3 (three) times daily.    [provider]  amLODipine (NORVASC) 10 MG tablet Take 10 mg by mouth daily.  01/19/17   [provider]  aspirin 81 MG chewable tablet Chew 81 mg by mouth daily.    [provider]  atorvastatin (LIPITOR) 20 MG tablet Take 40 mg by mouth daily. 05/01/19   [provider]  carvedilol (COREG) 3.125 MG tablet Take 6.25 mg by mouth 2 (two) times daily. 05/01/19   [provider]  clonazePAM (KLONOPIN) 0.5 MG tablet Take 0.5 mg by mouth 2 (two) times daily as needed for anxiety.    [provider]  cyclobenzaprine (FLEXERIL) 10 MG tablet Take 10 mg by mouth 3 (three) times daily as needed for muscle spasms.    [provider]  diazepam (VALIUM) 5 MG tablet Take 5 mg by mouth every 12 (twelve) hours as  needed. 11/02/19   [provider]  ferrous sulfate 324 (65 Fe) MG TBEC Take 1 tablet by mouth daily. Patient not taking: Reported on 01/19/2021    [provider]  FLUTICASONE PROPIONATE, INHAL, IN Inhale into the lungs daily.    [provider]  gabapentin (NEURONTIN) 300 MG capsule Take 600 mg by mouth 2 (two) times daily.     [provider]  omeprazole (PRILOSEC) 20 MG capsule Take 20 mg by mouth daily.    [provider]  ondansetron (ZOFRAN ODT) 4 MG disintegrating tablet Take 1 tablet (4 mg total) by mouth every 8 (eight) hours as needed for nausea or vomiting. 04/09/20   Gilles Chiquito, MD  pantoprazole (PROTONIX) 40 MG tablet Take 40 mg by mouth daily.    [provider]  SUMAtriptan (IMITREX) 100 MG tablet Take 100 mg by mouth every 2 (two) hours as needed for migraine. May repeat in 2 hours if headache persists or recurs.    [provider]  warfarin (COUMADIN) 4 MG tablet Take 4 mg by mouth. Takes 2 tabs once a day on Thursday, Friday, Saturday, Sunday.    [provider]  warfarin (COUMADIN) 5 MG tablet Take 5 mg by mouth daily. Take 1 tablet daily on Monday, Tuesday, and Wednesday.    [provider]      Allergies    Bactrim [sulfamethoxazole-trimethoprim], Tramadol, and Latex    Review of Systems  Review of Systems  All other systems reviewed and are negative.   Physical Exam Updated Vital Signs BP (!) 192/107   Pulse 85   Temp 98.3 F (36.8 C) (Oral)   Resp 18   Ht  (1.702 m)   Wt 88.5 kg   LMP 06/29/2022   SpO2 100%   BMI 30.54 kg/m  Physical Exam Vitals and nursing note reviewed.  Constitutional:      General: She is not in acute distress.    Appearance: She is well-developed.  HENT:     Head: Normocephalic and atraumatic.  Eyes:     Conjunctiva/sclera: Conjunctivae normal.  Cardiovascular:     Rate and Rhythm: Normal rate and regular rhythm.  Pulmonary:     Effort:  Pulmonary effort is normal. No respiratory distress.     Breath sounds: Normal breath sounds. No stridor.  Abdominal:     Tenderness: There is abdominal tenderness. There is guarding.  Skin:    General: Skin is warm and dry.  Neurological:     Mental Status: She is alert and oriented to person, place, and time.     Cranial Nerves: No cranial nerve deficit.  Psychiatric:        Mood and Affect: Mood normal.     ED Results / Procedures / Treatments   Labs (all labs ordered are listed, but only abnormal results are displayed) Labs Reviewed  COMPREHENSIVE METABOLIC PANEL - Abnormal; Notable for the following components:      Result Value   Potassium 3.2 (*)    Glucose, Bld 132 (*)    Total Protein 8.8 (*)    All other components within normal limits  CBC WITH DIFFERENTIAL/PLATELET - Abnormal; Notable for the following components:   WBC 15.3 (*)    Neutro Abs 12.1 (*)    Monocytes Absolute 1.1 (*)    All other components within normal limits  PROTIME-INR - Abnormal; Notable for the following components:   Prothrombin Time 23.7 (*)    INR 2.1 (*)    All other components within normal limits  ETHANOL  LIPASE, BLOOD  URINALYSIS, ROUTINE W REFLEX MICROSCOPIC    EKG None  Radiology CT Abdomen Pelvis W Contrast  Result Date: 07/06/2022 CLINICAL DATA:  Left lower quadrant pain. Nausea vomiting and diarrhea. Hiccuping EXAM: CT ABDOMEN AND PELVIS WITH CONTRAST TECHNIQUE: Multidetector CT imaging of the abdomen and pelvis was performed using the standard protocol following bolus administration of intravenous contrast. RADIATION DOSE REDUCTION: This exam was performed according to the departmental dose-optimization program which includes automated exposure control, adjustment of the mA and/or kV according to patient size and/or use of iterative reconstruction technique. CONTRAST:  OMNIPAQUE IOHEXOL 300 MG/ML  SOLN COMPARISON:  CT 04/05/2022.  Older exams as well. FINDINGS: Lower  chest: Breathing motion at the lung bases. No pleural effusion. Patulous esophagus with some luminal fluid. Hepatobiliary: Focal fat deposition seen in the liver adjacent to the falciform ligament in segment 4. Previous cholecystectomy. Patent portal vein. Pancreas: Mild atrophy of the pancreas. Spleen: Normal in size without focal abnormality. Adrenals/Urinary Tract: Nonspecific mild thickening of the adrenal glands. No enhancing renal mass or collecting system dilatation. There is some atrophy along the upper pole left kidney. Adjacent small Bosniak 1 cyst. No specific imaging follow-up. Preserved contours of the urinary bladder. There is some luminal high density in the bladder. Possibly related to the previous contrast administration. Please correlate with the patient's renal function. Stomach/Bowel: The large bowel is  nondilated. Normal appendix extends medial to the cecum in the right hemipelvis. Stomach is distended with fluid and some debris. Small bowel is nondilated. Few loops of small bowel in the pelvis slight wall thickening, nonspecific. Vascular/Lymphatic: Mild vascular calcifications. Normal caliber aorta and IVC. No specific abnormal lymph node enlargement identified in the abdomen and pelvis. Reproductive: Lobular uterus with fibroids. No separate adnexal mass. Other: No free air or free fluid. Musculoskeletal: Scattered degenerative changes seen of the spine and pelvis. IMPRESSION: No bowel obstruction, free air or free fluid. Normal appendix. There are some subtle areas of thickening along the distal small bowel. Please correlate for any clinical evidence of subtle enteritis. Colon is collapsed. Previous cholecystectomy. Distended stomach with a patulous esophagus.  Luminal fluid. Electronically Signed   By: Karen Kays M.D.   On: 07/06/2022 13:29   DG Chest 2 View  Result Date: 07/06/2022 CLINICAL DATA:  Weakness.  History of mitral valve replacement. EXAM: CHEST - 2 VIEW COMPARISON:   04/09/2020. FINDINGS: Clear lungs. Stable cardiac and mediastinal contours with postoperative changes of mitral valve replacement. Surgical clips project over the right hilum and right chest wall. No pleural effusion or pneumothorax. Visualized bones and upper abdomen are unremarkable. IMPRESSION: No evidence of acute cardiopulmonary disease. Electronically Signed   By: Orvan Falconer M.D.   On: 07/06/2022 12:00    Procedures Procedures    Medications Ordered in ED Medications  ciprofloxacin (CIPRO) IVPB 400 mg (has no administration in time range)  metroNIDAZOLE (FLAGYL) IVPB 500 mg (has no administration in time range)  0.9 % NaCl with KCl 20 mEq/ L  infusion (has no administration in time range)  LORazepam (ATIVAN) injection 1 mg (1 mg Intravenous Given 07/06/22 1227)  iohexol (OMNIPAQUE) 300 MG/ML solution 100 mL (100 mLs Intravenous Contrast Given 07/06/22 1319)    ED Course/ Medical Decision Making/ A&P                             Medical Decision Making With history of hypertension, mitral valve repair presents with abdominal pain nausea, vomiting, p.o. intolerance.  Differential includes obstruction versus infection, with consideration of electrolyte abnormalities, subtherapeutic INR. Patient had labs fluids Zofran CT scan. Pulse ox 99% room air normal   Amount and/or Complexity of Data Reviewed Independent Historian:     Details: Daughter External Data Reviewed: notes.    Details: ED visit from earlier this month reviewed at [facility]: ordered. Decision-making details documented in ED Course. Radiology: ordered and independent interpretation performed. Decision-making details documented in ED Course.  Risk Prescription drug management. Decision regarding hospitalization.   2:22 PM Patient not actively vomiting anymore, feels minimally better.  I reviewed her CT, discussed with her and her daughter.  With concern for enteritis, inability to take oral meds  including Coumadin, patient will be admitted for ongoing fluid resuscitation, antiemetics, antibiotics and appropriate anticoagulation. Given some improvement, and absence of evidence for abscess, perforation, there is some reassurance from early studies.        Final Clinical Impression(s) / ED Diagnoses Final diagnoses:  Patrick Jupiter     Gerhard Munch, MD 07/06/22 1423

## 2022-07-06 NOTE — Progress Notes (Signed)
Report received from Dawn S RN

## 2022-07-06 NOTE — Hospital Course (Signed)
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  She has been seen at person [facility] ED twice in given Zofran and Phenergan with no relief.  She is not able to keep anything down.  She has not been able to take her medications including warfarin.  She had diarrhea today.  She had vomiting earlier this morning.  She also was complaining of left lower quadrant abdominal pain and right flank pain.  She denies fever and chills.  She has been treated in this emergency department aggressively with IV fluid hydration.  Her labs reveals a white blood cell count of 15.3 and potassium of 3.2 with a glucose of 132.  She was clinically dehydrated and not able to eat or drink.  Her INR was 2.1.  She was sent for a chest x-ray with no acute findings.  CT abdomen and pelvis with contrast completed with findings concerning for small bowel enteritis, the colon was collapsed.  Given inability to tolerate oral liquids and oral medications she is being admitted for further management.  She has been started on IV antibiotics and admission requested.

## 2022-07-06 NOTE — H&P (Signed)
History and Physical  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  She has been seen at person [facility] ED twice in given Zofran and Phenergan with no relief.  She is not able to keep anything down.  She has not been able to take her medications including warfarin.  She had diarrhea today.  She had vomiting earlier this morning.  She also was complaining of left lower quadrant abdominal pain and right flank pain.  She denies fever and chills.  She has been treated in this emergency department aggressively with IV fluid hydration.  Her labs reveals a white blood cell count of 15.3 and potassium of 3.2 with a glucose of 132.  She was clinically dehydrated and not able to eat or drink.  Her INR was 2.1.  She was sent for a chest x-ray with no acute findings.  CT abdomen and pelvis with contrast completed with findings concerning for small bowel enteritis, the colon was collapsed.  Given inability to tolerate oral liquids and oral medications she is being admitted for further management.  She has been started on IV antibiotics and admission requested.    Past Medical History:  Diagnosis Date   Abnormal Pap smear of cervix    Asthma    Dyspnea    GERD (gastroesophageal reflux disease)    Headache    Heart disease    cardial murmur   Hyperlipidemia    Hypertension    Mitral regurgitation     Trichomoniasis     Past Surgical History:  Procedure Laterality Date   COLONOSCOPY WITH PROPOFOL N/A 01/19/2021   Procedure: COLONOSCOPY WITH PROPOFOL;  Surgeon: Regis Bill, MD;  Location: [facility] ENDOSCOPY;  Service: Endoscopy;  Laterality: N/A;   ESOPHAGOGASTRODUODENOSCOPY (EGD) WITH PROPOFOL N/A 01/19/2021   Procedure: ESOPHAGOGASTRODUODENOSCOPY (EGD) WITH PROPOFOL;  Surgeon: Regis Bill, MD;  Location: [facility] ENDOSCOPY;  Service: Endoscopy;  Laterality: N/A;  PT/INR OFFICE SAYS PATIENT NEEDS ABX   MITRAL VALVE REPLACEMENT  11/20/2013     reports that she has been smoking cigarettes. She has a 3.75 pack-year smoking history. She has never used smokeless tobacco. She reports current drug use. Drug: Marijuana. She reports that she does not drink alcohol.  Allergies  Allergen Reactions   Bactrim [Sulfamethoxazole-Trimethoprim] Rash   Tramadol Hives   Latex Rash    Family History  Problem Relation Age of Onset   Diabetes Mother    Heart disease Father    Heart attack Father    Hypertension Other    Stroke Other     Prior to Admission medications   Medication Sig Start Date End Date Taking? Authorizing Provider  albuterol (PROVENTIL HFA;VENTOLIN HFA) 108 (90 Base) MCG/ACT inhaler Inhale 2 puffs into the lungs every 4 (four) hours as needed for wheezing or shortness of breath.   Yes [provider]  albuterol (PROVENTIL) (2.5 MG/3ML) 0.083% nebulizer solution Take 2.5 mg by nebulization 3 (three) times daily.   Yes  [provider]  amLODipine (NORVASC) 10 MG tablet Take 5 mg by mouth daily. 01/19/17  Yes [provider]  aspirin 81 MG chewable tablet Chew 81 mg by mouth daily.   Yes [provider]  atorvastatin (LIPITOR) 20 MG tablet Take 40 mg by mouth daily. 05/01/19  Yes [provider]  carvedilol (COREG) 3.125 MG tablet Take 6.25 mg by mouth 2 (two) times daily. 05/01/19  Yes [provider]  clonazePAM (KLONOPIN)  0.5 MG tablet Take 0.5 mg by mouth 2 (two) times daily as needed for anxiety.   Yes [provider]  cyclobenzaprine (FLEXERIL) 10 MG tablet Take 10 mg by mouth 3 (three) times daily as needed for muscle spasms.   Yes [provider]  enoxaparin (LOVENOX) 100 MG/ML injection Inject 0.9 mLs into the skin every 12 (twelve) hours. 05/06/22  Yes [provider]  FLUTICASONE PROPIONATE, INHAL, IN Inhale into the lungs daily.   Yes [provider]  gabapentin (NEURONTIN) 300 MG capsule Take 600 mg by mouth 2 (two) times daily.    Yes [provider]  omeprazole (PRILOSEC) 20 MG capsule Take 20 mg by mouth daily.   Yes [provider]  ondansetron (ZOFRAN ODT) 4 MG disintegrating tablet Take 1 tablet (4 mg total) by mouth every 8 (eight) hours as needed for nausea or vomiting. 04/09/20  Yes Gilles Chiquito, MD  pantoprazole (PROTONIX) 40 MG tablet Take 40 mg by mouth daily.   Yes [provider]  SUMAtriptan (IMITREX) 100 MG tablet Take 100 mg by mouth every 2 (two) hours as needed for migraine. May repeat in 2 hours if headache persists or recurs.   Yes [provider]  warfarin (COUMADIN) 4 MG tablet Take 4 mg by mouth See admin instructions. Take  8mg  on Tuesday,Thursday and Saturday take 6.5 all other days   Yes [provider]  warfarin (COUMADIN) 5 MG tablet Take 5 mg by mouth daily. Take 1 tablet daily on Monday, Tuesday, and Wednesday. Patient not taking: Reported on 07/06/2022    [provider]    Physical Exam: Vitals:   07/06/22 1115 07/06/22 1117 07/06/22 1200 07/06/22 1536  BP:  (!) 192/107 (!) 164/95 (!) 177/88  Pulse: 85  83   Resp: 18     Temp:  98.3 F (36.8 C)    TempSrc:  Oral    SpO2: 100%  100%   Weight:      Height:        Constitutional: NAD, calm, comfortable Eyes: PERRL, lids and conjunctivae normal ENMT: Mucous membranes are dry Posterior pharynx clear of any exudate or  lesions.Normal dentition.  Neck: normal, supple, no masses, no thyromegaly Respiratory: clear to auscultation bilaterally, no wheezing, no crackles. Normal respiratory effort. No accessory muscle use.  Cardiovascular: normal s1, s2 sounds, no murmurs / rubs / gallops. No extremity edema. 2+ pedal pulses. No carotid bruits.  Abdomen: RLQ tenderness, no masses palpated. No hepatosplenomegaly. Bowel sounds positive.  Musculoskeletal: no clubbing / cyanosis. No joint deformity upper and lower extremities. Good ROM, no contractures. Normal muscle tone.  Skin: no rashes, lesions, ulcers. No induration Neurologic: CN 2-12 grossly intact. Sensation intact, DTR normal. Strength 5/5 in all 4.  Psychiatric: Normal judgment and insight. Alert and oriented x 3. Normal mood.   Labs on Admission: I have personally reviewed following labs and imaging studies  CBC: Recent Labs  Lab 07/06/22 1134  WBC 15.3*  NEUTROABS 12.1*  HGB 15.0  HCT 44.9  MCV 93.9  PLT 384   Basic Metabolic Panel: Recent Labs  Lab 07/06/22 1134 07/06/22 1459  NA 137  --   K 3.2*  --   CL 100  --   CO2 26  --   GLUCOSE 132*  --   BUN 13  --   CREATININE 1.00  --   CALCIUM 10.1  --   MG  --  2.0   GFR: Estimated Creatinine Clearance: 82.1 mL/min (by C-G formula based on SCr of 1 mg/dL). Liver Function Tests: Recent Labs  Lab 07/06/22 1134  AST 28  ALT 24  ALKPHOS 98  BILITOT 0.8  PROT 8.8*  ALBUMIN 4.8   Recent Labs  Lab 07/06/22 1134  LIPASE 24   No results for input(s): "AMMONIA" in the last 168 hours. Coagulation Profile: Recent Labs  Lab 07/06/22 1134  INR 2.1*   Cardiac Enzymes: No results for input(s): "CKTOTAL", "CKMB", "CKMBINDEX", "TROPONINI" in the last 168 hours. BNP (last 3 results) No results for input(s): "PROBNP" in the last 8760 hours. HbA1C: No results for input(s): "HGBA1C" in the last 72 hours. CBG: No results for input(s): "GLUCAP" in the last 168 hours. Lipid Profile: No  results for input(s): "CHOL", "HDL", "LDLCALC", "TRIG", "CHOLHDL", "LDLDIRECT" in the last 72 hours. Thyroid Function Tests: No results for input(s): "TSH", "T4TOTAL", "FREET4", "T3FREE", "THYROIDAB" in the last 72 hours. Anemia Panel: No results for input(s): "VITAMINB12", "FOLATE", "FERRITIN", "TIBC", "IRON", "RETICCTPCT" in the last 72 hours. Urine analysis:    Component Value Date/Time   COLORURINE YELLOW (A) 06/24/2021 1034   APPEARANCEUR HAZY (A) 06/24/2021 1034   LABSPEC 1.020 06/24/2021 1034   PHURINE 5.0 06/24/2021 1034   GLUCOSEU NEGATIVE 06/24/2021 1034   HGBUR MODERATE (A) 06/24/2021 1034   BILIRUBINUR NEGATIVE 06/24/2021 1034   KETONESUR NEGATIVE 06/24/2021 1034   PROTEINUR NEGATIVE 06/24/2021 1034   NITRITE NEGATIVE 06/24/2021 1034   LEUKOCYTESUR NEGATIVE 06/24/2021 1034    Radiological Exams on Admission: CT Abdomen Pelvis W Contrast  Result Date: 07/06/2022 CLINICAL DATA:  Left lower quadrant pain. Nausea vomiting and diarrhea. Hiccuping EXAM: CT ABDOMEN AND PELVIS WITH CONTRAST TECHNIQUE: Multidetector CT imaging of the abdomen and pelvis was performed using the standard protocol following bolus administration of intravenous contrast. RADIATION DOSE REDUCTION: This exam was performed according to the departmental dose-optimization program which includes automated exposure control, adjustment of the mA and/or kV according to patient size and/or use of iterative reconstruction technique. CONTRAST:  OMNIPAQUE IOHEXOL 300 MG/ML  SOLN COMPARISON:  CT 04/05/2022.  Older exams as well. FINDINGS: Lower chest: Breathing motion at the lung bases. No pleural effusion. Patulous esophagus with some luminal fluid. Hepatobiliary: Focal fat deposition seen in the liver adjacent to the falciform ligament in segment 4. Previous cholecystectomy. Patent portal vein. Pancreas: Mild atrophy of the pancreas. Spleen: Normal in size without focal abnormality. Adrenals/Urinary Tract: Nonspecific  mild thickening of the adrenal glands. No enhancing renal mass or collecting system dilatation. There is some atrophy along the upper pole left kidney. Adjacent small Bosniak 1 cyst. No specific imaging follow-up. Preserved contours of the urinary bladder. There is some luminal high density in the bladder. Possibly related to the previous contrast administration. Please correlate with the patient's renal function. Stomach/Bowel: The large bowel is nondilated. Normal appendix extends medial to the cecum in the right hemipelvis. Stomach is distended with fluid and some debris. Small bowel is nondilated. Few loops of small bowel in the pelvis slight wall  thickening, nonspecific. Vascular/Lymphatic: Mild vascular calcifications. Normal caliber aorta and IVC. No specific abnormal lymph node enlargement identified in the abdomen and pelvis. Reproductive: Lobular uterus with fibroids. No separate adnexal mass. Other: No free air or free fluid. Musculoskeletal: Scattered degenerative changes seen of the spine and pelvis. IMPRESSION: No bowel obstruction, free air or free fluid. Normal appendix. There are some subtle areas of thickening along the distal small bowel. Please correlate for any clinical evidence of subtle enteritis. Colon is collapsed. Previous cholecystectomy. Distended stomach with a patulous esophagus.  Luminal fluid. Electronically Signed   By: Karen Kays M.D.   On: 07/06/2022 13:29   DG Chest 2 View  Result Date: 07/06/2022 CLINICAL DATA:  Weakness.  History of mitral valve replacement. EXAM: CHEST - 2 VIEW COMPARISON:  04/09/2020. FINDINGS: Clear lungs. Stable cardiac and mediastinal contours with postoperative changes of mitral valve replacement. Surgical clips project over the right hilum and right chest wall. No pleural effusion or pneumothorax. Visualized bones and upper abdomen are unremarkable. IMPRESSION: No evidence of acute cardiopulmonary disease. Electronically Signed   By: Orvan Falconer  M.D.   On: 07/06/2022 12:00    Assessment/Plan Principal Problem:   Enteritis Active Problems:   Leukocytosis   Hyperlipidemia, mixed   Acid reflux   Current tobacco use   Migraine headache   Atrial fibrillation, transient   Abdominal pain   Essential hypertension   S/P mitral valve replacement with metallic valve   Hypokalemia   Intractable nausea and vomiting   Hyperglycemia   Small bowel Enteritis  - continue IV ciprofloxacin 400 mg BID  - continue metronidazole 500 mg  BID  - continue orders for temporary bowel rest  - supportive measures ordered with IV fluids - IV pain and nausea medication ordered - holding home oral medications as she not able to tolerate at this time   Leukocytosis  - secondary to intraabdominal infection  - continue IV antibiotics and supportive measures  - blood cultures not obtained in ED prior to antibiotics - repeat CBC/diff in AM   Hypokalemia - from GI losses - check Mg - IV replacement of potassium ordered - recheck BMP in AM   Intractable nausea and vomiting  - secondary to enteritis  - bowel rest for now - IV ondansetron ordered  - IV prochlorperazine for breakthru nausea and vomiting  - IV fluid hydration - correct electrolytes  GERD  - IV pantoprazole ordered for GI protection   Metallic Mitral Valve on longterm anticoagulation  - INR suboptimal at 2.0 due to not able to take oral warfarin  - bridge now with full dose enoxaparin until able to take oral again  - pharm D consulted to dose enoxaparin   Abdominal Pain RLQ - secondary to enteritis  - treating supportive with IV pain medication - treating enteritis as noted   Transient Atrial Fibrillation  - unable to take oral carvedilol at this time  - will schedule metoprolol IV every 6 hours for HR control - enoxaparin full dose for full anticoagulation   Hyperglycemia  - most likely stress induced - check A1c to screen for DM  Hyperlipidemia - hold home oral  meds until able to take p.o.    Essential Hypertension  - suboptimally controlled - adding scheduled metoprolol and PRN hydralazine   DVT prophylaxis: enoxaparin   Code Status: full   Family Communication: daughter at bedside   Disposition Plan: anticipate home   Consults called:   Admission status: OBV  Level of care: Med-Surg Standley Dakins MD [facility] How to contact the [facility] Attending or Consulting provider 7A - 7P or covering provider during after hours 7P -7A, for this patient?  Check the care team in [facility] and look for a) attending/consulting TR[facility] provider listed and b) the [facility] team listed Log into [email or web address] and use [facility] universal password to access. If you do not have the password, please contact the hospital operator. Locate the [facility] provider you are looking for under [facility] and page to a number that you can be directly reached. If you still have difficulty reaching the provider, please page the [employer] (Director on Call) for the Hospitalists listed on amion for assistance.   If 7PM-7AM, please contact night-coverage [email or web address] Password [facility]  07/06/2022, 4:12 PM

## 2022-07-06 NOTE — Progress Notes (Signed)
   07/06/22 1616  Vitals  Temp 97.8 F (36.6 C)  Temp Source Oral  BP (!) 177/116  MAP (mmHg) 134  BP Location Right Arm  BP Method Automatic  Patient Position (if appropriate) Sitting  Pulse Rate 85  Pulse Rate Source Dinamap  Level of Consciousness  Level of Consciousness Alert  MEWS COLOR  MEWS Score Color Green  Oxygen Therapy  SpO2 100 %  O2 Device Room Air  Pain Assessment  Pain Scale 0-10  Pain Score 9  Pain Type Acute pain  Pain Location Head (and back)  Pain Descriptors / Indicators Aching;Discomfort;Throbbing;Headache  Pain Frequency Constant  Pain Onset On-going  Patients Stated Pain Goal 2  Pain Intervention(s) RN made aware  MEWS Score  MEWS Temp 0  MEWS Systolic 0  MEWS Pulse 0  MEWS RR 0  MEWS LOC 0  MEWS Score 0  Provider Notification  Provider Name/Title Dr Laural Benes  Date Provider Notified 07/06/22  Time Provider Notified 1617  Method of Notification Page  Notification Reason Other (Comment) (B/P 177/116)  Provider response See new orders  Date of Provider Response 07/06/22  Time of Provider Response 1623

## 2022-07-06 NOTE — ED Triage Notes (Signed)
Pt complains of n/v/d x 1 week ago. Has been seen at [facility] twice and given Zofran and phenergan with no relief. Pt states diarrhea 1 time today and threw up from 2am-6am this morning. Abd pain in LLQ and back pain on right side.

## 2022-07-06 NOTE — Progress Notes (Addendum)
Patient arrived from ED to room 319 department 300. Alert and oriented times four.B/P 177/116, Dr Laural Benes notified. Hydralazine 10 mg IV per MAR PRN given. See new orders. Patient c/o headache and back pain rated a 9, oxycodone 5 mg by mouth given per MAR PRN. Call bell in reach. Family at bedside. Plan of care on going.

## 2022-07-07 ENCOUNTER — Observation Stay (HOSPITAL_COMMUNITY): Payer: Medicaid Other

## 2022-07-07 ENCOUNTER — Other Ambulatory Visit (HOSPITAL_COMMUNITY): Payer: Self-pay

## 2022-07-07 DIAGNOSIS — D72829 Elevated white blood cell count, unspecified: Secondary | ICD-10-CM | POA: Diagnosis not present

## 2022-07-07 DIAGNOSIS — K529 Noninfective gastroenteritis and colitis, unspecified: Secondary | ICD-10-CM | POA: Diagnosis present

## 2022-07-07 DIAGNOSIS — K219 Gastro-esophageal reflux disease without esophagitis: Secondary | ICD-10-CM | POA: Diagnosis present

## 2022-07-07 DIAGNOSIS — F419 Anxiety disorder, unspecified: Secondary | ICD-10-CM | POA: Diagnosis present

## 2022-07-07 DIAGNOSIS — Z8249 Family history of ischemic heart disease and other diseases of the circulatory system: Secondary | ICD-10-CM | POA: Diagnosis not present

## 2022-07-07 DIAGNOSIS — D509 Iron deficiency anemia, unspecified: Secondary | ICD-10-CM | POA: Diagnosis present

## 2022-07-07 DIAGNOSIS — Z79899 Other long term (current) drug therapy: Secondary | ICD-10-CM | POA: Diagnosis not present

## 2022-07-07 DIAGNOSIS — Z885 Allergy status to narcotic agent status: Secondary | ICD-10-CM | POA: Diagnosis not present

## 2022-07-07 DIAGNOSIS — R0609 Other forms of dyspnea: Secondary | ICD-10-CM | POA: Diagnosis present

## 2022-07-07 DIAGNOSIS — E876 Hypokalemia: Secondary | ICD-10-CM | POA: Diagnosis present

## 2022-07-07 DIAGNOSIS — I480 Paroxysmal atrial fibrillation: Secondary | ICD-10-CM | POA: Diagnosis present

## 2022-07-07 DIAGNOSIS — Z7901 Long term (current) use of anticoagulants: Secondary | ICD-10-CM | POA: Diagnosis not present

## 2022-07-07 DIAGNOSIS — Z7982 Long term (current) use of aspirin: Secondary | ICD-10-CM | POA: Diagnosis not present

## 2022-07-07 DIAGNOSIS — R112 Nausea with vomiting, unspecified: Secondary | ICD-10-CM | POA: Diagnosis not present

## 2022-07-07 DIAGNOSIS — J309 Allergic rhinitis, unspecified: Secondary | ICD-10-CM | POA: Diagnosis present

## 2022-07-07 DIAGNOSIS — G43909 Migraine, unspecified, not intractable, without status migrainosus: Secondary | ICD-10-CM | POA: Diagnosis present

## 2022-07-07 DIAGNOSIS — F17210 Nicotine dependence, cigarettes, uncomplicated: Secondary | ICD-10-CM | POA: Diagnosis present

## 2022-07-07 DIAGNOSIS — Z952 Presence of prosthetic heart valve: Secondary | ICD-10-CM | POA: Diagnosis not present

## 2022-07-07 DIAGNOSIS — Z823 Family history of stroke: Secondary | ICD-10-CM | POA: Diagnosis not present

## 2022-07-07 DIAGNOSIS — E860 Dehydration: Secondary | ICD-10-CM | POA: Diagnosis present

## 2022-07-07 DIAGNOSIS — R739 Hyperglycemia, unspecified: Secondary | ICD-10-CM | POA: Diagnosis present

## 2022-07-07 DIAGNOSIS — Z833 Family history of diabetes mellitus: Secondary | ICD-10-CM | POA: Diagnosis not present

## 2022-07-07 DIAGNOSIS — E782 Mixed hyperlipidemia: Secondary | ICD-10-CM | POA: Diagnosis present

## 2022-07-07 DIAGNOSIS — Z91040 Latex allergy status: Secondary | ICD-10-CM | POA: Diagnosis not present

## 2022-07-07 DIAGNOSIS — Z882 Allergy status to sulfonamides status: Secondary | ICD-10-CM | POA: Diagnosis not present

## 2022-07-07 DIAGNOSIS — I10 Essential (primary) hypertension: Secondary | ICD-10-CM | POA: Diagnosis present

## 2022-07-07 LAB — CBC WITH DIFFERENTIAL/PLATELET
Abs Immature Granulocytes: 0.03 10*3/uL (ref 0.00–0.07)
Basophils Absolute: 0 10*3/uL (ref 0.0–0.1)
Basophils Relative: 0 %
Eosinophils Absolute: 0 10*3/uL (ref 0.0–0.5)
Eosinophils Relative: 0 %
HCT: 42.4 % (ref 36.0–46.0)
Hemoglobin: 14 g/dL (ref 12.0–15.0)
Immature Granulocytes: 0 %
Lymphocytes Relative: 35 %
Lymphs Abs: 3.9 10*3/uL (ref 0.7–4.0)
MCH: 31.6 pg (ref 26.0–34.0)
MCHC: 33 g/dL (ref 30.0–36.0)
MCV: 95.7 fL (ref 80.0–100.0)
Monocytes Absolute: 0.9 10*3/uL (ref 0.1–1.0)
Monocytes Relative: 8 %
Neutro Abs: 6.1 10*3/uL (ref 1.7–7.7)
Neutrophils Relative %: 57 %
Platelets: 327 10*3/uL (ref 150–400)
RBC: 4.43 MIL/uL (ref 3.87–5.11)
RDW: 14.6 % (ref 11.5–15.5)
WBC: 11 10*3/uL — ABNORMAL HIGH (ref 4.0–10.5)
nRBC: 0 % (ref 0.0–0.2)

## 2022-07-07 LAB — BASIC METABOLIC PANEL
Anion gap: 9 (ref 5–15)
BUN: 14 mg/dL (ref 6–20)
CO2: 24 mmol/L (ref 22–32)
Calcium: 9.2 mg/dL (ref 8.9–10.3)
Chloride: 102 mmol/L (ref 98–111)
Creatinine, Ser: 0.97 mg/dL (ref 0.44–1.00)
GFR, Estimated: >60 mL/min (ref >60)
Glucose, Bld: 131 mg/dL — ABNORMAL HIGH (ref 70–99)
Potassium: 3.3 mmol/L — ABNORMAL LOW (ref 3.5–5.1)
Sodium: 135 mmol/L (ref 135–145)

## 2022-07-07 LAB — ECHOCARDIOGRAM COMPLETE
Height: 67 in
MV M vel: 2.43 m/s
MV Peak grad: 23.6 mmHg
S' Lateral: 2.6 cm
Weight: 3033.53 oz

## 2022-07-07 LAB — MAGNESIUM: Magnesium: 2 mg/dL (ref 1.7–2.4)

## 2022-07-07 LAB — PROTIME-INR
INR: 2.4 — ABNORMAL HIGH (ref 0.8–1.2)
Prothrombin Time: 26.3 seconds — ABNORMAL HIGH (ref 11.4–15.2)

## 2022-07-07 MED ORDER — WARFARIN SODIUM 5 MG PO TABS: 8.0000 mg | ORAL_TABLET | Freq: Once | ORAL | Status: DC

## 2022-07-07 MED ORDER — WARFARIN - PHARMACIST DOSING INPATIENT: Freq: Every day | Status: DC

## 2022-07-07 MED ORDER — WARFARIN SODIUM 4 MG PO TABS
8.0000 mg | ORAL_TABLET | Freq: Once | ORAL | Status: DC
  Filled 2022-07-07: qty 2

## 2022-07-07 MED ORDER — WARFARIN SODIUM 5 MG PO TABS
8.0000 mg | ORAL_TABLET | Freq: Once | ORAL | Status: AC
  Administered 2022-07-07: 8 mg via ORAL
  Filled 2022-07-07: qty 1

## 2022-07-07 MED ORDER — ENOXAPARIN SODIUM 80 MG/0.8ML IJ SOSY
80.0000 mg | PREFILLED_SYRINGE | Freq: Two times a day (BID) | INTRAMUSCULAR | Status: DC
  Administered 2022-07-07 – 2022-07-08 (×2): 80 mg via SUBCUTANEOUS
  Filled 2022-07-07 (×2): qty 0.8

## 2022-07-07 MED ORDER — SODIUM CHLORIDE 0.9 % IV SOLN
2.0000 g | INTRAVENOUS | Status: DC
  Administered 2022-07-07: 2 g via INTRAVENOUS
  Filled 2022-07-07: qty 20

## 2022-07-07 MED ORDER — CALCIUM CARBONATE ANTACID 500 MG PO CHEW
400.0000 mg | CHEWABLE_TABLET | Freq: Two times a day (BID) | ORAL | Status: DC
  Administered 2022-07-07 – 2022-07-08 (×2): 400 mg via ORAL
  Filled 2022-07-07 (×2): qty 2

## 2022-07-07 MED ORDER — POTASSIUM CHLORIDE CRYS ER 20 MEQ PO TBCR
40.0000 meq | EXTENDED_RELEASE_TABLET | ORAL | Status: AC
  Administered 2022-07-07 (×2): 40 meq via ORAL
  Filled 2022-07-07 (×2): qty 2

## 2022-07-07 NOTE — Progress Notes (Signed)
Echocardiogram 2D Echocardiogram has been performed.  Anne Kirby 07/07/2022, 1:32 PM

## 2022-07-07 NOTE — TOC Benefit Eligibility Note (Signed)
Patient Product/process development scientist completed.    The patient is currently admitted and upon discharge could be taking enoxaparin (Lovenox) 80 mg/0.8 ml.  The current 10 day co-pay is $4.00.   The patient is insured through [employer] [place] [employer]   This test claim was processed through [facility]- copay amounts may vary at other pharmacies due to pharmacy/plan contracts, or as the patient moves through the different stages of their insurance plan.  Roland Earl, CPHT Pharmacy Patient Advocate Specialist [facility] Patient Advocate Team Direct Number: [phone]  Fax: [phone]

## 2022-07-07 NOTE — Progress Notes (Signed)
Mobility Specialist Progress Note:    07/07/22 1118  Mobility  Activity Ambulated with assistance in hallway  Level of Assistance Modified independent, requires aide device or extra time  Assistive Device Other (Comment) (IV pole)  Distance Ambulated (ft) 100 ft  Activity Response Tolerated well  Mobility Referral Yes  $Mobility charge 1 Mobility   Pt agreeable to mobility session. Tolerated well, c/o shoulder pain. Required ModI with IV pole for stability. No other AD in use. Pt felt SOB and fatigued halfway through session, took 1 standing rest break to recover, SpO2 95% on RA. Returned pt to room, all needs met.   Feliciana Rossetti Mobility Specialist Please contact via Special educational needs teacher or  Rehab office at [phone]

## 2022-07-07 NOTE — Progress Notes (Signed)
PROGRESS NOTE     Anne Kirby, is a 42 y.o. female, DOB - 13-Jul-1980, ZOX:[id number]  Admit date - 07/06/2022   Admitting Physician Cleora Fleet, MD  Outpatient Primary MD for the patient is 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  She has been seen at person [facility] ED twice in given Zofran and Phenergan with no relief.  She is not able to keep anything down.  She has not been able to take her medications including warfarin.  She had diarrhea today.  She had vomiting earlier this morning.  She also was complaining of left lower quadrant abdominal pain and right flank pain.  She denies fever and chills.  She has been treated in this emergency department aggressively with IV fluid hydration.  Her labs reveals a white blood cell count of 15.3 and potassium of 3.2 with a glucose of 132.  She was clinically dehydrated and not able to eat or drink.  Her INR was 2.1.  She was sent for a chest x-ray with no acute findings.  CT abdomen and pelvis with contrast completed with findings concerning for small bowel enteritis, the colon was collapsed.  Given inability to tolerate oral liquids and oral medications she is being admitted for further management.  She has been started on IV antibiotics and admission requested.    -Assessment and Plan: 1) presumed enterocolitis----stop Cipro due to cytochrome P450 interaction concerns in the patient on Coumadin -Okay to substitute Rocephin for Cipro -Continue Flagyl for now -Abdominal discomfort and diarrhea with nausea appears to be improving -WBC trending  down -  2) prosthetic mitral valve/chronic anticoagulation----mitral valve placed in 2015 -echocardiogram from 07/07/2022 shows prosthetic mitral valve in good position, no significant concerns INR currently 2.4 however patient has been getting Cipro and Flagyl both of which inhibit cytochrome P450 -Discussed with pharmacist -Currently getting Lovenox bridge -Pharmacy to manage Coumadin therapy  3)PAFIb--continue beta-blocker, anticoagulation as above #2  4) hypokalemia--due to GI losses, replace and recheck  5) patient with significant dyspnea on exertion--- echo is unrevealing -Currently on anticoagulation -Chest x-ray from 07/06/2022 without acute findings  Disposition--- complex case with multiple medications that interact with Coumadin with risk for overcorrection of INR--- --possible discharge home in 1 to 2 days if tolerating oral intake and Coumadin levels are more predictable -In the meantime continue Lovenox bridge and replace electrolytes, also continue IV antibiotics for enterocolitis as above #1  Status is: Inpatient   Disposition: The patient is from: Home              Anticipated d/c is to: Home              Anticipated d/c date is: 1 day              Patient currently is not medically stable to d/c. Barriers: Not Clinically Stable- --  Code Status :  -  Code Status: Full Code   Family Communication:    NA (patient is alert, awake and coherent)   DVT Prophylaxis  :   - SCD/Lovenox   warfarin (COUMADIN) tablet 8 mg  Lab Results  Component Value Date   PLT 327 07/07/2022    Inpatient Medications  Scheduled Meds:  albuterol  2.5 mg Nebulization TID   budesonide  0.25 mg Nebulization BID   enoxaparin (LOVENOX) injection  80 mg Subcutaneous BID   metoprolol tartrate  5 mg Intravenous Q6H   pantoprazole (PROTONIX) IV  40 mg Intravenous Q24H   warfarin  8 mg Oral ONCE-1600   Warfarin - Pharmacist Dosing Inpatient   Does not apply q1600   Continuous  Infusions:  0.9 % NaCl with KCl 20 mEq / L 100 mL/hr at 07/07/22 0850   ciprofloxacin 400 mg (07/07/22 0334)   metronidazole 500 mg (07/07/22 1218)   PRN Meds:.acetaminophen **OR** acetaminophen, albuterol, bisacodyl, fentaNYL (SUBLIMAZE) injection, hydrALAZINE, ondansetron **OR** ondansetron (ZOFRAN) IV, oxyCODONE, prochlorperazine   Anti-infectives (From admission, onward)    Start     Dose/Rate Route Frequency Ordered Stop   07/07/22 0400  ciprofloxacin (CIPRO) IVPB 400 mg        400 mg 200 mL/hr over 60 Minutes Intravenous Every 12 hours 07/06/22 1450     07/07/22 0000  metroNIDAZOLE (FLAGYL) IVPB 500 mg        500 mg 100 mL/hr over 60 Minutes Intravenous Every 12 hours 07/06/22 1450     07/06/22 1430  ciprofloxacin (CIPRO) IVPB 400 mg        400 mg 200 mL/hr over 60 Minutes Intravenous  Once 07/06/22 1415 07/06/22 1640   07/06/22 1430  metroNIDAZOLE (FLAGYL) IVPB 500 mg        500 mg 100 mL/hr over 60 Minutes Intravenous  Once 07/06/22 1415 07/06/22 1641         Subjective: [name] today has no fevers,  - Patient reports significant dyspnea on exertion-- -no frank chest pains complex case with multiple medications that interact with Coumadin with risk for overcorrection of INR--- --possible discharge home in 1 to 2 days if tolerating oral intake and Coumadin levels are more predictable -In the meantime continue Lovenox bridge and replace electrolytes, also continue IV antibiotics for enterocolitis as above #1 -   Objective: Vitals:   07/07/22 0820 07/07/22 1207 07/07/22 1344 07/07/22 1421  BP: 118/80 131/85  (!) 141/77  Pulse: 85 82  95  Resp: 18     Temp: 98.2 F (36.8 C)   98.1 F (36.7 C)  TempSrc: Oral   Oral  SpO2: 99%  99% 99%  Weight:      Height:        Intake/Output Summary (Last 24 hours) at 07/07/2022 1633 Last data filed at 07/07/2022 0900 Gross per 24 hour  Intake 1580 ml  Output --  Net 1580 ml   Filed Weights   07/06/22 1111  07/07/22 0637  Weight: 88.5 kg 86 kg    Physical Exam  Gen:- Awake Alert, no acute distress, DOE HEENT:- [place].AT, No , sclera icterus Neck-Supple Neck,No JVD,.  Lungs-  CTAB , fair symmetrical air movement CV- S1, S2 normal, regular , mitral valve metallic click Abd-  +ve B.Sounds, Abd Soft, No tenderness,    Extremity/Skin:- No  edema, pedal pulses present  Psych-affect is appropriate, oriented x3 Neuro-no new focal deficits, no tremors  Data Reviewed: I have personally reviewed following labs and imaging studies  CBC: Recent Labs  Lab 07/06/22 1134 07/07/22 0421  WBC 15.3* 11.0*  NEUTROABS 12.1* 6.1  HGB 15.0 14.0  HCT 44.9 42.4  MCV 93.9 95.7  PLT 384 327   Basic Metabolic  Panel: Recent Labs  Lab 07/06/22 1134 07/06/22 1459 07/07/22 0421  NA 137  --  135  K 3.2*  --  3.3*  CL 100  --  102  CO2 26  --  24  GLUCOSE 132*  --  131*  BUN 13  --  14  CREATININE 1.00  --  0.97  CALCIUM 10.1  --  9.2  MG  --  2.0 2.0   GFR: Estimated Creatinine Clearance: 83.4 mL/min (by C-G formula based on SCr of 0.97 mg/dL). Liver Function Tests: Recent Labs  Lab 07/06/22 1134  AST 28  ALT 24  ALKPHOS 98  BILITOT 0.8  PROT 8.8*  ALBUMIN 4.8   Radiology Studies: ECHOCARDIOGRAM COMPLETE  Result Date: 07/07/2022    ECHOCARDIOGRAM REPORT   Patient Name:   KASSIA DEMARINIS Date of Exam: 07/07/2022 Medical Rec #:  [id number]               Height:       67.0 in Accession #:    [id number]              Weight:       189.6 lb Date of Birth:  01-13-1981                BSA:          1.977 m Patient Age:    42 years                BP:           131/85 mmHg Patient Gender: F                       HR:           73 bpm. [facility] Procedure: 2D Echo, Color Doppler and Cardiac Doppler Indications:    Dyspnea R06.00  History:        Patient has no prior history of Echocardiogram examinations.                 Arrythmias:Atrial Fibrillation, Signs/Symptoms:Dyspnea; Risk                  Factors:Hypertension, Dyslipidemia and Current Smoker.                  Mitral Valve: 25 mm OnX mechanical prosthesis valve is present                 in the mitral position. Procedure Date: 2015.  Sonographer:    Aron Baba Referring Phys: [id number] Niccolo Burggraf  Sonographer Comments: Image acquisition challenging due to patient body habitus and Image acquisition challenging due to respiratory motion. IMPRESSIONS  1. Left ventricular ejection fraction, by estimation, is 70 to 75%. The left ventricle has hyperdynamic function. The left ventricle has no regional wall motion abnormalities. There is mild concentric left ventricular hypertrophy. Left ventricular diastolic parameters are indeterminate.  2. Right ventricular systolic function is normal. The right ventricular size is normal. There is normal pulmonary artery systolic pressure. The estimated right ventricular systolic pressure is 23.2 mmHg.  3. Left atrial size was severely dilated.  4. The mitral valve has been repaired/replaced. Trivial mitral valve regurgitation. The mean mitral valve gradient is 5.0 mmHg. There is a 25 mm OnX mechanical prosthesis present in the mitral position. Procedure Date: 2015. Grossly normal prosthetic function.  5. The aortic valve is tricuspid. Aortic valve regurgitation is not visualized.  6.  The inferior vena cava is normal in size with greater than 50% respiratory variability, suggesting right atrial pressure of 3 mmHg. Comparison(s): Prior images unable to be directly viewed. FINDINGS  Left Ventricle: Left ventricular ejection fraction, by estimation, is 70 to 75%. The left ventricle has hyperdynamic function. The left ventricle has no regional wall motion abnormalities. The left ventricular internal cavity size was normal in size. There is mild concentric left ventricular hypertrophy. Left ventricular diastolic function could not be evaluated due to mitral valve replacement. Left ventricular diastolic parameters  are indeterminate. Right Ventricle: The right ventricular size is normal. No increase in right ventricular wall thickness. Right ventricular systolic function is normal. There is normal pulmonary artery systolic pressure. The tricuspid regurgitant velocity is 2.25 m/s, and  with an assumed right atrial pressure of 3 mmHg, the estimated right ventricular systolic pressure is 23.2 mmHg. Left Atrium: Left atrial size was severely dilated. Right Atrium: Right atrial size was normal in size. Pericardium: There is no evidence of pericardial effusion. Mitral Valve: The mitral valve has been repaired/replaced. Trivial mitral valve regurgitation. There is a 25 mm OnX mechanical prosthesis present in the mitral position. Procedure Date: 2015. The mean mitral valve gradient is 5.0 mmHg. Tricuspid Valve: The tricuspid valve is grossly normal. Tricuspid valve regurgitation is mild. Aortic Valve: The aortic valve is tricuspid. Aortic valve regurgitation is not visualized. Pulmonic Valve: The pulmonic valve was grossly normal. Pulmonic valve regurgitation is trivial. Aorta: The aortic root is normal in size and structure. Venous: The inferior vena cava is normal in size with greater than 50% respiratory variability, suggesting right atrial pressure of 3 mmHg. IAS/Shunts: No atrial level shunt detected by color flow Doppler.  LEFT VENTRICLE PLAX 2D LVIDd:         4.80 cm   Diastology LVIDs:         2.60 cm   LV e' medial:  6.96 cm/s LV PW:         1.10 cm   LV e' lateral: 5.77 cm/s LV IVS:        0.90 cm LVOT diam:     1.50 cm LV SV:         45 LV SV Index:   23 LVOT Area:     1.77 cm  RIGHT VENTRICLE RV S prime:     11.40 cm/s TAPSE (M-mode): 2.2 cm LEFT ATRIUM              Index        RIGHT ATRIUM           Index LA diam:        4.20 cm  2.12 cm/m   RA Area:     21.00 cm LA Vol (A2C):   81.0 ml  40.97 ml/m  RA Volume:   59.50 ml  30.10 ml/m LA Vol (A4C):   106.0 ml 53.62 ml/m LA Biplane Vol: 101.0 ml 51.09 ml/m  AORTIC  VALVE LVOT Vmax:   135.00 cm/s LVOT Vmean:  96.400 cm/s LVOT VTI:    0.253 m  AORTA Ao Root diam: 3.10 cm Ao Asc diam:  3.40 cm MITRAL VALVE              TRICUSPID VALVE MV Mean grad: 5.0 mmHg    TR Peak grad:   20.2 mmHg MR Peak grad: 23.6 mmHg   TR Vmax:        225.00 cm/s MR Mean grad: 5.0 mmHg MR Vmax:  243.00 cm/s SHUNTS MR Vmean:     111.0 cm/s  Systemic VTI:  0.25 m                           Systemic Diam: 1.50 cm Nona Dell MD Electronically signed by Nona Dell MD Signature Date/Time: 07/07/2022/2:08:18 PM    Final    CT Abdomen Pelvis W Contrast  Result Date: 07/06/2022 CLINICAL DATA:  Left lower quadrant pain. Nausea vomiting and diarrhea. Hiccuping EXAM: CT ABDOMEN AND PELVIS WITH CONTRAST TECHNIQUE: Multidetector CT imaging of the abdomen and pelvis was performed using the standard protocol following bolus administration of intravenous contrast. RADIATION DOSE REDUCTION: This exam was performed according to the departmental dose-optimization program which includes automated exposure control, adjustment of the mA and/or kV according to patient size and/or use of iterative reconstruction technique. CONTRAST:  OMNIPAQUE IOHEXOL 300 MG/ML  SOLN COMPARISON:  CT 04/05/2022.  Older exams as well. FINDINGS: Lower chest: Breathing motion at the lung bases. No pleural effusion. Patulous esophagus with some luminal fluid. Hepatobiliary: Focal fat deposition seen in the liver adjacent to the falciform ligament in segment 4. Previous cholecystectomy. Patent portal vein. Pancreas: Mild atrophy of the pancreas. Spleen: Normal in size without focal abnormality. Adrenals/Urinary Tract: Nonspecific mild thickening of the adrenal glands. No enhancing renal mass or collecting system dilatation. There is some atrophy along the upper pole left kidney. Adjacent small Bosniak 1 cyst. No specific imaging follow-up. Preserved contours of the urinary bladder. There is some luminal high density in the  bladder. Possibly related to the previous contrast administration. Please correlate with the patient's renal function. Stomach/Bowel: The large bowel is nondilated. Normal appendix extends medial to the cecum in the right hemipelvis. Stomach is distended with fluid and some debris. Small bowel is nondilated. Few loops of small bowel in the pelvis slight wall thickening, nonspecific. Vascular/Lymphatic: Mild vascular calcifications. Normal caliber aorta and IVC. No specific abnormal lymph node enlargement identified in the abdomen and pelvis. Reproductive: Lobular uterus with fibroids. No separate adnexal mass. Other: No free air or free fluid. Musculoskeletal: Scattered degenerative changes seen of the spine and pelvis. IMPRESSION: No bowel obstruction, free air or free fluid. Normal appendix. There are some subtle areas of thickening along the distal small bowel. Please correlate for any clinical evidence of subtle enteritis. Colon is collapsed. Previous cholecystectomy. Distended stomach with a patulous esophagus.  Luminal fluid. Electronically Signed   By: Karen Kays M.D.   On: 07/06/2022 13:29   DG Chest 2 View  Result Date: 07/06/2022 CLINICAL DATA:  Weakness.  History of mitral valve replacement. EXAM: CHEST - 2 VIEW COMPARISON:  04/09/2020. FINDINGS: Clear lungs. Stable cardiac and mediastinal contours with postoperative changes of mitral valve replacement. Surgical clips project over the right hilum and right chest wall. No pleural effusion or pneumothorax. Visualized bones and upper abdomen are unremarkable. IMPRESSION: No evidence of acute cardiopulmonary disease. Electronically Signed   By: Orvan Falconer M.D.   On: 07/06/2022 12:00     Scheduled Meds:  albuterol  2.5 mg Nebulization TID   budesonide  0.25 mg Nebulization BID   enoxaparin (LOVENOX) injection  80 mg Subcutaneous BID   metoprolol tartrate  5 mg Intravenous Q6H   pantoprazole (PROTONIX) IV  40 mg Intravenous Q24H   warfarin   8 mg Oral ONCE-1600   Warfarin - Pharmacist Dosing Inpatient   Does not apply q1600   Continuous Infusions:  0.9 %  NaCl with KCl 20 mEq / L 100 mL/hr at 07/07/22 0850   ciprofloxacin 400 mg (07/07/22 0334)   metronidazole 500 mg (07/07/22 1218)     LOS: 0 days    Shon Hale M.D on 07/07/2022 at 4:33 PM  Go to [email or web address] - for contact info  [facility] - Office  [phone]  If 7PM-7AM, please contact night-coverage [email or web address] 07/07/2022, 4:33 PM

## 2022-07-07 NOTE — Progress Notes (Addendum)
ANTICOAGULATION CONSULT NOTE   Pharmacy Consult for lovenox Indication: mechanical mitral valve  Allergies  Allergen Reactions   Bactrim [Sulfamethoxazole-Trimethoprim] Rash   Tramadol Hives   Latex Rash    Patient Measurements: Height:  (170.2 cm) Weight: 86 kg (189 lb 9.5 oz) IBW/kg (Calculated) : 61.6  Vital Signs: Temp: 98.2 F (36.8 C) (04/17 0820) Temp Source: Oral (04/17 0820) BP: 118/80 (04/17 0820) Pulse Rate: 85 (04/17 0820)  Labs: Recent Labs    07/06/22 1134 07/07/22 0421  HGB 15.0 14.0  HCT 44.9 42.4  PLT 384 327  LABPROT 23.7*  --   INR 2.1*  --   CREATININE 1.00 0.97     Estimated Creatinine Clearance: 83.4 mL/min (by C-G formula based on SCr of 0.97 mg/dL).   Medical History: Past Medical History:  Diagnosis Date   Abnormal Pap smear of cervix    Asthma    Dyspnea    GERD (gastroesophageal reflux disease)    Headache    Heart disease    cardial murmur   Hyperlipidemia    Hypertension    Mitral regurgitation    Trichomoniasis     Assessment: Pharmacy consulted to dose lovenox in patient with mechanical mitral valve who is unable to take PTA warfarin due to enteritis. Patient's INR on admission is subtherapeutic at 2.1 (2.5-3.5 goal).   INR up to 2.4, weight down slightly will adjust lovenox. CBC stable overnight, no bleeding issues noted. Ok with MD to restart warfarin today. Patient states n/v is improved and she feels comfortable taking po tonight.   INR likely to continue to trend up after warfarin restarted and patient now unlikely to need further lovenox after this evenings dose. Will give higher dose of  today to get INR up to goal by tomorrow. Patient also notes she has 3 enoxaparin syringes left over from previous prescription.   Goal of Therapy:  Anti-Xa level 0.6-1 units/ml 4hrs after LMWH dose given INR goal 2.5-3.5 Monitor platelets by anticoagulation protocol: Yes   Plan:  Continue Lovenox 80 mg subq every 12  hours for today Warfarin  tonight INR recheck in am  Sheppard Coil PharmD., BCPS Clinical Pharmacist 07/07/2022 8:54 AM

## 2022-07-07 NOTE — Progress Notes (Signed)
  Transition of Care [facility]) Screening Note   Patient Details  Name: Anne Kirby Date of Birth: November 09, 1980   Transition of [facility] CM/SW Contact:    Annice Needy, LCSW Phone Number: 07/07/2022, 12:01 PM    Transition of Care Department [facility]) has reviewed patient and no TOC needs have been identified at this time. We will continue to monitor patient advancement through interdisciplinary progression rounds. If new patient transition needs arise, please place a TOC consult.

## 2022-07-07 NOTE — Progress Notes (Signed)
Patient lying in bed with eyes closed resting. Patient's BP was elevated during beginning of shift, prn medications were given and medication was effective. Patient complained of a headache due the the elevated BP, prn pain medication given. Pt. Stated the headache went completely away. Later on, pt. Complained of pain in the head of a 5 on a scale of 0-10. PRN med given. No further complaints or concerns from patient. Will continue to monitor and report off to the oncoming shift accordingly.

## 2022-07-08 DIAGNOSIS — K529 Noninfective gastroenteritis and colitis, unspecified: Secondary | ICD-10-CM | POA: Diagnosis not present

## 2022-07-08 LAB — RENAL FUNCTION PANEL
Albumin: 3.4 g/dL — ABNORMAL LOW (ref 3.5–5.0)
Anion gap: 4 — ABNORMAL LOW (ref 5–15)
BUN: 12 mg/dL (ref 6–20)
CO2: 22 mmol/L (ref 22–32)
Calcium: 8.7 mg/dL — ABNORMAL LOW (ref 8.9–10.3)
Chloride: 108 mmol/L (ref 98–111)
Creatinine, Ser: 0.86 mg/dL (ref 0.44–1.00)
GFR, Estimated: >60 mL/min (ref >60)
Glucose, Bld: 98 mg/dL (ref 70–99)
Phosphorus: 2.4 mg/dL — ABNORMAL LOW (ref 2.5–4.6)
Potassium: 4 mmol/L (ref 3.5–5.1)
Sodium: 134 mmol/L — ABNORMAL LOW (ref 135–145)

## 2022-07-08 LAB — CBC WITH DIFFERENTIAL/PLATELET
Abs Immature Granulocytes: 0.02 10*3/uL (ref 0.00–0.07)
Basophils Absolute: 0.1 10*3/uL (ref 0.0–0.1)
Basophils Relative: 1 %
Eosinophils Absolute: 0.1 10*3/uL (ref 0.0–0.5)
Eosinophils Relative: 1 %
HCT: 38.8 % (ref 36.0–46.0)
Hemoglobin: 12.9 g/dL (ref 12.0–15.0)
Immature Granulocytes: 0 %
Lymphocytes Relative: 38 %
Lymphs Abs: 3.5 10*3/uL (ref 0.7–4.0)
MCH: 31.7 pg (ref 26.0–34.0)
MCHC: 33.2 g/dL (ref 30.0–36.0)
MCV: 95.3 fL (ref 80.0–100.0)
Monocytes Absolute: 0.8 10*3/uL (ref 0.1–1.0)
Monocytes Relative: 8 %
Neutro Abs: 4.9 10*3/uL (ref 1.7–7.7)
Neutrophils Relative %: 52 %
Platelets: 298 10*3/uL (ref 150–400)
RBC: 4.07 MIL/uL (ref 3.87–5.11)
RDW: 14.2 % (ref 11.5–15.5)
WBC: 9.3 10*3/uL (ref 4.0–10.5)
nRBC: 0 % (ref 0.0–0.2)

## 2022-07-08 LAB — PROTIME-INR
INR: 2.7 — ABNORMAL HIGH (ref 0.8–1.2)
Prothrombin Time: 28.2 seconds — ABNORMAL HIGH (ref 11.4–15.2)

## 2022-07-08 MED ORDER — CEFDINIR 300 MG PO CAPS: 300.0000 mg | ORAL_CAPSULE | Freq: Two times a day (BID) | ORAL | 0 refills | Status: AC

## 2022-07-08 MED ORDER — PANTOPRAZOLE SODIUM 40 MG PO TBEC: 40.0000 mg | DELAYED_RELEASE_TABLET | Freq: Every day | ORAL | 3 refills | Status: AC

## 2022-07-08 MED ORDER — GABAPENTIN 300 MG PO CAPS: 600.0000 mg | ORAL_CAPSULE | Freq: Two times a day (BID) | ORAL | 4 refills | Status: AC

## 2022-07-08 MED ORDER — CEFDINIR 300 MG PO CAPS
300.0000 mg | ORAL_CAPSULE | Freq: Once | ORAL | Status: AC
  Administered 2022-07-08: 300 mg via ORAL
  Filled 2022-07-08: qty 1

## 2022-07-08 MED ORDER — WARFARIN SODIUM 4 MG PO TABS: 4.0000 mg | ORAL_TABLET | ORAL | 1 refills | Status: AC

## 2022-07-08 MED ORDER — SUMATRIPTAN SUCCINATE 100 MG PO TABS: 100.0000 mg | ORAL_TABLET | ORAL | 0 refills | Status: AC | PRN

## 2022-07-08 MED ORDER — AMLODIPINE BESYLATE 10 MG PO TABS: 5.0000 mg | ORAL_TABLET | Freq: Every day | ORAL | 3 refills | Status: AC

## 2022-07-08 MED ORDER — WARFARIN SODIUM 5 MG PO TABS: 6.0000 mg | ORAL_TABLET | Freq: Once | ORAL | Status: DC

## 2022-07-08 MED ORDER — CARVEDILOL 6.25 MG PO TABS: 6.2500 mg | ORAL_TABLET | Freq: Two times a day (BID) | ORAL | 4 refills | Status: AC

## 2022-07-08 MED ORDER — ALBUTEROL SULFATE (2.5 MG/3ML) 0.083% IN NEBU: 2.5000 mg | INHALATION_SOLUTION | Freq: Three times a day (TID) | RESPIRATORY_TRACT | 12 refills | Status: AC

## 2022-07-08 MED ORDER — ALBUTEROL SULFATE HFA 108 (90 BASE) MCG/ACT IN AERS: 2.0000 | INHALATION_SPRAY | RESPIRATORY_TRACT | 2 refills | Status: AC | PRN

## 2022-07-08 MED ORDER — SODIUM CHLORIDE 0.9 % IV SOLN: 2.0000 g | Freq: Once | INTRAVENOUS | Status: DC

## 2022-07-08 MED ORDER — ONDANSETRON 4 MG PO TBDP: 4.0000 mg | ORAL_TABLET | Freq: Three times a day (TID) | ORAL | 0 refills | Status: AC | PRN

## 2022-07-08 MED ORDER — ASPIRIN 81 MG PO CHEW: 81.0000 mg | CHEWABLE_TABLET | Freq: Every day | ORAL | 1 refills | Status: AC

## 2022-07-08 MED ORDER — METRONIDAZOLE 500 MG PO TABS: 500.0000 mg | ORAL_TABLET | Freq: Two times a day (BID) | ORAL | 0 refills | Status: AC

## 2022-07-08 NOTE — Progress Notes (Signed)
Pt's blood pressure has been slightly elevated. Pt stated Metoprolol has been working well for her. Pt reported a headache, PRN Tylenol given.

## 2022-07-08 NOTE — Discharge Summary (Signed)
Anne Kirby, is a 42 y.o. female  DOB October 26, 1980  MRN [id number].  Admission date:  07/06/2022  Admitting Physician  Mehar Sagen Mariea Clonts, MD  Discharge Date:  07/08/2022   Primary MD  [facility]  Recommendations for primary care physician for things to follow:  1)Take Coumadin/Warfarin 6mg  (1.5 tablets) daily until seen for follow up in INR clinic. 2)Avoid ibuprofen/Advil/Aleve/Motrin/Goody Powders/Naproxen/BC powders/Meloxicam/Diclofenac/Indomethacin and other Nonsteroidal anti-inflammatory medications as these will make you more likely to bleed and can cause stomach ulcers, can also cause Kidney problems.  3) Your INR level will fluctuate while you are taking Metronidazole/Flagyl antibiotic 4)Please follow up with your cardiologist at the [facility] clinic 5)Repeat PT/INR every 48 to 72 hrs until you are off antibiotics for over 72 hours 6)Check CBC Blood Test on Monday 07/12/22  Admission Diagnosis  Enteritis [K52.9] Enterocolitis [K52.9]   Discharge Diagnosis  Enteritis [K52.9] Enterocolitis [K52.9]  ***  Principal Problem:   Enteritis Active Problems:   Leukocytosis   Hyperlipidemia, mixed   Acid reflux   Current tobacco use   Migraine headache   Atrial fibrillation, transient   Abdominal pain   Essential hypertension   S/P mitral valve replacement with metallic valve   Hypokalemia   Intractable nausea and vomiting   Hyperglycemia   Enterocolitis      Past Medical History:  Diagnosis Date   Abnormal Pap smear of cervix    Asthma    Dyspnea    GERD (gastroesophageal reflux disease)    Headache    Heart disease    cardial murmur   Hyperlipidemia    Hypertension    Mitral regurgitation    Trichomoniasis     Past Surgical History:  Procedure Laterality Date   COLONOSCOPY WITH PROPOFOL N/A 01/19/2021   Procedure: COLONOSCOPY WITH PROPOFOL;   Surgeon: Regis Bill, MD;  Location: [facility] ENDOSCOPY;  Service: Endoscopy;  Laterality: N/A;   ESOPHAGOGASTRODUODENOSCOPY (EGD) WITH PROPOFOL N/A 01/19/2021   Procedure: ESOPHAGOGASTRODUODENOSCOPY (EGD) WITH PROPOFOL;  Surgeon: Regis Bill, MD;  Location: [facility] ENDOSCOPY;  Service: Endoscopy;  Laterality: N/A;  PT/INR OFFICE SAYS PATIENT NEEDS ABX   MITRAL VALVE REPLACEMENT  11/20/2013       HPI  from the history and physical done on the day of admission:   Chief Complaint: nausea and vomiting   HPI: Anne Kirby is a 42 year old female with past medical history of metallic mitral valve replacement on warfarin (INR goal 2.5-3.5), hyperlipidemia, hypertension, anxiety, iron deficiency anemia and allergic rhinitis, reactive airways, GERD, migraine headaches presented to the ED planing of 1 week of nausea vomiting and diarrhea.  She has been seen at person [facility] ED twice in given Zofran and Phenergan with no relief.  She is not able to keep anything down.  She has not been able to take her medications including warfarin.  She had diarrhea today.  She had vomiting earlier this morning.  She also was complaining of left lower quadrant abdominal pain and right flank pain.  She denies fever and chills.  She has been treated in this emergency department aggressively with IV fluid hydration.  Her labs reveals a white blood cell count of 15.3 and potassium of 3.2 with a glucose of 132.  She was clinically dehydrated and not able to eat or drink.  Her INR was 2.1.  She was sent for a chest x-ray with no acute findings.  CT abdomen and pelvis with contrast completed with findings concerning for small bowel enteritis, the colon was collapsed.  Given inability to tolerate oral liquids and oral medications she is being admitted for further management.  She has been started on IV antibiotics and admission requested.     Hospital Course:     42 year old female with past medical history  of metallic mitral valve replacement on warfarin (INR goal 2.5-3.5), hyperlipidemia, hypertension, anxiety, iron deficiency anemia and allergic rhinitis, reactive airways, GERD, migraine headaches presented to the ED planing of 1 week of nausea vomiting and diarrhea.  She has been seen at person [facility] ED twice in given Zofran and Phenergan with no relief.  She is not able to keep anything down.  She has not been able to take her medications including warfarin.  She had diarrhea today.  She had vomiting earlier this morning.  She also was complaining of left lower quadrant abdominal pain and right flank pain.  She denies fever and chills.  She has been treated in this emergency department aggressively with IV fluid hydration.  Her labs reveals a white blood cell count of 15.3 and potassium of 3.2 with a glucose of 132.  She was clinically dehydrated and not able to eat or drink.  Her INR was 2.1.  She was sent for a chest x-ray with no acute findings.  CT abdomen and pelvis with contrast completed with findings concerning for small bowel enteritis, the colon was collapsed.  Given inability to tolerate oral liquids and oral medications she is being admitted for further management.  She has been started on IV antibiotics and admission requested.  ***** Assessment and Plan: 1) presumed enterocolitis----stop Cipro due to cytochrome P450 interaction concerns in the patient on Coumadin -Okay to substitute Rocephin for Cipro -Continue Flagyl for now -Abdominal discomfort and diarrhea with nausea appears to be improving -WBC trending down -   2) prosthetic mitral valve/chronic anticoagulation----mitral valve placed in 2015 -echocardiogram from 07/07/2022 shows prosthetic mitral valve in good position, no significant concerns INR currently 2.4 however patient has been getting Cipro and Flagyl both of which inhibit cytochrome P450 -Discussed with pharmacist -Currently getting Lovenox bridge -Pharmacy to  manage Coumadin therapy   3)PAFIb--continue beta-blocker, anticoagulation as above #2   4) hypokalemia--due to GI losses, replace and recheck   5) patient with significant dyspnea on exertion--- echo is unrevealing -Currently on anticoagulation -Chest x-ray from 07/06/2022 without acute findings   Disposition--- complex case with multiple medications that interact with Coumadin with risk for overcorrection of INR--- --possible discharge home in 1 to 2 days if tolerating oral intake and Coumadin levels are more predictable -In the meantime continue Lovenox bridge and replace electrolytes, also continue IV antibiotics for enterocolitis as above #1    Disposition: The patient is from: Home              Anticipated d/c is to: Home      Discharge Condition: ***  Follow UP     Consults obtained - ***  Diet and Activity recommendation:  As advised  Discharge Instructions    ****  Discharge Instructions     Call MD for:  difficulty breathing, headache or visual disturbances   Complete by: As directed    Call MD for:  persistant dizziness or light-headedness   Complete by: As directed    Call MD for:  persistant nausea and vomiting   Complete by: As directed    Call MD for:  temperature >100.4   Complete by: As directed    Diet - low sodium heart healthy   Complete by: As directed    Discharge instructions   Complete by: As directed    1)Take Coumadin/Warfarin 6mg  (1.5 tablets) daily until seen for follow up in INR clinic. 2)Avoid ibuprofen/Advil/Aleve/Motrin/Goody Powders/Naproxen/BC powders/Meloxicam/Diclofenac/Indomethacin and other Nonsteroidal anti-inflammatory medications as these will make you more likely to bleed and can cause stomach ulcers, can also cause Kidney problems.  3) Your INR level will fluctuate while you are taking Metronidazole/Flagyl antibiotic 4)Please follow up with your cardiologist at the [facility] clinic 5)Repeat PT/INR every 48 to 72 hrs until you  are off antibiotics for over 72 hours 6)Check CBC Blood Test on Monday 07/12/22   Increase activity slowly   Complete by: As directed          Discharge Medications     Allergies as of 07/08/2022       Reactions   Bactrim [sulfamethoxazole-trimethoprim] Rash   Tramadol Hives   Latex Rash        Medication List     STOP taking these medications    enoxaparin 100 MG/ML injection Commonly known as: LOVENOX   omeprazole 20 MG capsule Commonly known as: PRILOSEC       TAKE these medications    albuterol 108 (90 Base) MCG/ACT inhaler Commonly known as: VENTOLIN HFA Inhale 2 puffs into the lungs every 4 (four) hours as needed for wheezing or shortness of breath.   albuterol (2.5 MG/3ML) 0.083% nebulizer solution Commonly known as: PROVENTIL Take 3 mLs (2.5 mg total) by nebulization 3 (three) times daily.   amLODipine 10 MG tablet Commonly known as: NORVASC Take 0.5 tablets (5 mg total) by mouth daily.   aspirin 81 MG chewable tablet Chew 1 tablet (81 mg total) by mouth daily with breakfast. What changed: when to take this   atorvastatin 20 MG tablet Commonly known as: LIPITOR Take 40 mg by mouth daily.   carvedilol 6.25 MG tablet Commonly known as: Coreg Take 1 tablet (6.25 mg total) by mouth 2 (two) times daily with a meal. What changed:  medication strength when to take this   cefdinir 300 MG capsule Commonly known as: OMNICEF Take 1 capsule (300 mg total) by mouth 2 (two) times daily for 5 days.   clonazePAM 0.5 MG tablet Commonly known as: KLONOPIN Take 0.5 mg by mouth 2 (two) times daily as needed for anxiety.   cyclobenzaprine 10 MG tablet Commonly known as: FLEXERIL Take 10 mg by mouth 3 (three) times daily as needed for muscle spasms.   FLUTICASONE PROPIONATE (INHAL) IN Inhale into the lungs daily.   gabapentin 300 MG capsule Commonly known as: NEURONTIN Take 2 capsules (600 mg total) by mouth 2 (two) times daily.   metroNIDAZOLE 500  MG tablet Commonly known as: FLAGYL Take 1 tablet (500 mg total) by mouth 2 (two) times daily for 3 days.   ondansetron 4 MG disintegrating tablet Commonly known as: Zofran ODT Take 1 tablet (4 mg total) by mouth every 8 (eight) hours as needed for nausea or vomiting.   pantoprazole 40  MG tablet Commonly known as: PROTONIX Take 1 tablet (40 mg total) by mouth daily.   SUMAtriptan 100 MG tablet Commonly known as: IMITREX Take 1 tablet (100 mg total) by mouth every 2 (two) hours as needed for migraine or headache. May repeat in 2 hours if headache persists or recurs. What changed: reasons to take this   warfarin 4 MG tablet Commonly known as: COUMADIN Take 1 tablet (4 mg total) by mouth See admin instructions. Take 6mg  (1.5 tablets) daily until seen for follow up in INR clinic. What changed: additional instructions        Major procedures and Radiology Reports - PLEASE review detailed and final reports for all details, in brief -   ***  ECHOCARDIOGRAM COMPLETE  Result Date: 07/07/2022    ECHOCARDIOGRAM REPORT   Patient Name:   Anne Kirby Date of Exam: 07/07/2022 Medical Rec #:  [id number]               Height:       67.0 in Accession #:    [id number]              Weight:       189.6 lb Date of Birth:  [birth year]                BSA:          1.977 m Patient Age:    42 years                BP:           131/85 mmHg Patient Gender: F                       HR:           73 bpm. [facility] Procedure: 2D Echo, Color Doppler and Cardiac Doppler Indications:    Dyspnea R06.00  History:        Patient has no prior history of Echocardiogram examinations.                 Arrythmias:Atrial Fibrillation, Signs/Symptoms:Dyspnea; Risk                 Factors:Hypertension, Dyslipidemia and Current Smoker.                  Mitral Valve: 25 mm OnX mechanical prosthesis valve is present                 in the mitral position. Procedure Date: 2015.  Sonographer:    Aron Baba  Referring Phys: [id number] Shelly Spenser  Sonographer Comments: Image acquisition challenging due to patient body habitus and Image acquisition challenging due to respiratory motion. IMPRESSIONS  1. Left ventricular ejection fraction, by estimation, is 70 to 75%. The left ventricle has hyperdynamic function. The left ventricle has no regional wall motion abnormalities. There is mild concentric left ventricular hypertrophy. Left ventricular diastolic parameters are indeterminate.  2. Right ventricular systolic function is normal. The right ventricular size is normal. There is normal pulmonary artery systolic pressure. The estimated right ventricular systolic pressure is 23.2 mmHg.  3. Left atrial size was severely dilated.  4. The mitral valve has been repaired/replaced. Trivial mitral valve regurgitation. The mean mitral valve gradient is 5.0 mmHg. There is a 25 mm OnX mechanical prosthesis present in the mitral position. Procedure Date: 2015. Grossly normal prosthetic function.  5. The aortic valve is tricuspid. Aortic valve regurgitation is  not visualized.  6. The inferior vena cava is normal in size with greater than 50% respiratory variability, suggesting right atrial pressure of 3 mmHg. Comparison(s): Prior images unable to be directly viewed. FINDINGS  Left Ventricle: Left ventricular ejection fraction, by estimation, is 70 to 75%. The left ventricle has hyperdynamic function. The left ventricle has no regional wall motion abnormalities. The left ventricular internal cavity size was normal in size. There is mild concentric left ventricular hypertrophy. Left ventricular diastolic function could not be evaluated due to mitral valve replacement. Left ventricular diastolic parameters are indeterminate. Right Ventricle: The right ventricular size is normal. No increase in right ventricular wall thickness. Right ventricular systolic function is normal. There is normal pulmonary artery systolic pressure. The tricuspid  regurgitant velocity is 2.25 m/s, and  with an assumed right atrial pressure of 3 mmHg, the estimated right ventricular systolic pressure is 23.2 mmHg. Left Atrium: Left atrial size was severely dilated. Right Atrium: Right atrial size was normal in size. Pericardium: There is no evidence of pericardial effusion. Mitral Valve: The mitral valve has been repaired/replaced. Trivial mitral valve regurgitation. There is a 25 mm OnX mechanical prosthesis present in the mitral position. Procedure Date: 2015. The mean mitral valve gradient is 5.0 mmHg. Tricuspid Valve: The tricuspid valve is grossly normal. Tricuspid valve regurgitation is mild. Aortic Valve: The aortic valve is tricuspid. Aortic valve regurgitation is not visualized. Pulmonic Valve: The pulmonic valve was grossly normal. Pulmonic valve regurgitation is trivial. Aorta: The aortic root is normal in size and structure. Venous: The inferior vena cava is normal in size with greater than 50% respiratory variability, suggesting right atrial pressure of 3 mmHg. IAS/Shunts: No atrial level shunt detected by color flow Doppler.  LEFT VENTRICLE PLAX 2D LVIDd:         4.80 cm   Diastology LVIDs:         2.60 cm   LV e' medial:  6.96 cm/s LV PW:         1.10 cm   LV e' lateral: 5.77 cm/s LV IVS:        0.90 cm LVOT diam:     1.50 cm LV SV:         45 LV SV Index:   23 LVOT Area:     1.77 cm  RIGHT VENTRICLE RV S prime:     11.40 cm/s TAPSE (M-mode): 2.2 cm LEFT ATRIUM              Index        RIGHT ATRIUM           Index LA diam:        4.20 cm  2.12 cm/m   RA Area:     21.00 cm LA Vol (A2C):   81.0 ml  40.97 ml/m  RA Volume:   59.50 ml  30.10 ml/m LA Vol (A4C):   106.0 ml 53.62 ml/m LA Biplane Vol: 101.0 ml 51.09 ml/m  AORTIC VALVE LVOT Vmax:   135.00 cm/s LVOT Vmean:  96.400 cm/s LVOT VTI:    0.253 m  AORTA Ao Root diam: 3.10 cm Ao Asc diam:  3.40 cm MITRAL VALVE              TRICUSPID VALVE MV Mean grad: 5.0 mmHg    TR Peak grad:   20.2 mmHg MR Peak grad:  23.6 mmHg   TR Vmax:        225.00 cm/s MR Mean grad: 5.0 mmHg MR  Vmax:      243.00 cm/s SHUNTS MR Vmean:     111.0 cm/s  Systemic VTI:  0.25 m                           Systemic Diam: 1.50 cm Nona Dell MD Electronically signed by Nona Dell MD Signature Date/Time: 07/07/2022/2:08:18 PM    Final    CT Abdomen Pelvis W Contrast  Result Date: 07/06/2022 CLINICAL DATA:  Left lower quadrant pain. Nausea vomiting and diarrhea. Hiccuping EXAM: CT ABDOMEN AND PELVIS WITH CONTRAST TECHNIQUE: Multidetector CT imaging of the abdomen and pelvis was performed using the standard protocol following bolus administration of intravenous contrast. RADIATION DOSE REDUCTION: This exam was performed according to the departmental dose-optimization program which includes automated exposure control, adjustment of the mA and/or kV according to patient size and/or use of iterative reconstruction technique. CONTRAST:  OMNIPAQUE IOHEXOL 300 MG/ML  SOLN COMPARISON:  CT 04/05/2022.  Older exams as well. FINDINGS: Lower chest: Breathing motion at the lung bases. No pleural effusion. Patulous esophagus with some luminal fluid. Hepatobiliary: Focal fat deposition seen in the liver adjacent to the falciform ligament in segment 4. Previous cholecystectomy. Patent portal vein. Pancreas: Mild atrophy of the pancreas. Spleen: Normal in size without focal abnormality. Adrenals/Urinary Tract: Nonspecific mild thickening of the adrenal glands. No enhancing renal mass or collecting system dilatation. There is some atrophy along the upper pole left kidney. Adjacent small Bosniak 1 cyst. No specific imaging follow-up. Preserved contours of the urinary bladder. There is some luminal high density in the bladder. Possibly related to the previous contrast administration. Please correlate with the patient's renal function. Stomach/Bowel: The large bowel is nondilated. Normal appendix extends medial to the cecum in the right hemipelvis. Stomach  is distended with fluid and some debris. Small bowel is nondilated. Few loops of small bowel in the pelvis slight wall thickening, nonspecific. Vascular/Lymphatic: Mild vascular calcifications. Normal caliber aorta and IVC. No specific abnormal lymph node enlargement identified in the abdomen and pelvis. Reproductive: Lobular uterus with fibroids. No separate adnexal mass. Other: No free air or free fluid. Musculoskeletal: Scattered degenerative changes seen of the spine and pelvis. IMPRESSION: No bowel obstruction, free air or free fluid. Normal appendix. There are some subtle areas of thickening along the distal small bowel. Please correlate for any clinical evidence of subtle enteritis. Colon is collapsed. Previous cholecystectomy. Distended stomach with a patulous esophagus.  Luminal fluid. Electronically Signed   By: Karen Kays M.D.   On: 07/06/2022 13:29   DG Chest 2 View  Result Date: 07/06/2022 CLINICAL DATA:  Weakness.  History of mitral valve replacement. EXAM: CHEST - 2 VIEW COMPARISON:  04/09/2020. FINDINGS: Clear lungs. Stable cardiac and mediastinal contours with postoperative changes of mitral valve replacement. Surgical clips project over the right hilum and right chest wall. No pleural effusion or pneumothorax. Visualized bones and upper abdomen are unremarkable. IMPRESSION: No evidence of acute cardiopulmonary disease. Electronically Signed   By: Orvan Falconer M.D.   On: 07/06/2022 12:00    Micro Results   *** No results found for this or any previous visit (from the past 240 hour(s)).  Today   Subjective    Anne Kirby today has no ***          Patient has been seen and examined prior to discharge   Objective   Blood pressure 132/79, pulse 74, temperature 98.6 F (37 C), temperature source Oral, resp.  rate 16, height 5\' 7"  (1.702 m), weight 88.3 kg, last menstrual period 06/29/2022, SpO2 95 %.   Intake/Output Summary (Last 24 hours) at 07/08/2022 1132 Last data  filed at 07/08/2022 0700 Gross per 24 hour  Intake 240 ml  Output --  Net 240 ml    Exam Gen:- Awake Alert, no acute distress *** HEENT:- [place].AT, No sclera icterus Neck-Supple Neck,No JVD,.  Lungs-  CTAB , good air movement bilaterally CV- S1, S2 normal, regular Abd-  +ve B.Sounds, Abd Soft, No tenderness,    Extremity/Skin:- No  edema,   good pulses Psych-affect is appropriate, oriented x3 Neuro-no new focal deficits, no tremors ***   Data Review   CBC w Diff:  Lab Results  Component Value Date   WBC 9.3 07/08/2022   HGB 12.9 07/08/2022   HCT 38.8 07/08/2022   PLT 298 07/08/2022   LYMPHOPCT 38 07/08/2022   MONOPCT 8 07/08/2022   EOSPCT 1 07/08/2022   BASOPCT 1 07/08/2022    CMP:  Lab Results  Component Value Date   NA 134 (L) 07/08/2022   K 4.0 07/08/2022   CL 108 07/08/2022   CO2 22 07/08/2022   BUN 12 07/08/2022   CREATININE 0.86 07/08/2022   PROT 8.8 (H) 07/06/2022   ALBUMIN 3.4 (L) 07/08/2022   BILITOT 0.8 07/06/2022   ALKPHOS 98 07/06/2022   AST 28 07/06/2022   ALT 24 07/06/2022  .  Total Discharge time is about 33 minutes  Shon Hale M.D on 07/08/2022 at 11:32 AM  Go to [email or web address] -  for contact info  [facility] - Office  [phone]

## 2022-07-08 NOTE — Discharge Instructions (Signed)
1)Take Coumadin/Warfarin  (1.5 tablets) daily until seen for follow up in INR clinic. 2)Avoid ibuprofen/Advil/Aleve/Motrin/Goody Powders/Naproxen/BC powders/Meloxicam/Diclofenac/Indomethacin and other Nonsteroidal anti-inflammatory medications as these will make you more likely to bleed and can cause stomach ulcers, can also cause Kidney problems.  3) Your INR level will fluctuate while you are taking Metronidazole/Flagyl antibiotic 4)Please follow up with your cardiologist at the [facility] clinic 5)Repeat PT/INR every 48 to 72 hrs until you are off antibiotics for over 72 hours 6)Check CBC Blood Test on Monday 07/12/22

## 2022-07-08 NOTE — Progress Notes (Signed)
ANTICOAGULATION CONSULT NOTE   Pharmacy Consult for lovenox>warfarin Indication: mechanical mitral valve  Allergies  Allergen Reactions   Bactrim [Sulfamethoxazole-Trimethoprim] Rash   Tramadol Hives   Latex Rash    Patient Measurements: Height:  (170.2 cm) Weight: 88.3 kg (194 lb 10.7 oz) IBW/kg (Calculated) : 61.6  Vital Signs: Temp: 98.6 F (37 C) (04/18 0514) Temp Source: Oral (04/18 0514) BP: 132/79 (04/18 0514) Pulse Rate: 74 (04/18 0713)  Labs: Recent Labs    07/06/22 1134 07/07/22 0421 07/07/22 1041 07/08/22 0450  HGB 15.0 14.0  --  12.9  HCT 44.9 42.4  --  38.8  PLT 384 327  --  298  LABPROT 23.7*  --  26.3* 28.2*  INR 2.1*  --  2.4* 2.7*  CREATININE 1.00 0.97  --  0.86     Estimated Creatinine Clearance: 95.3 mL/min (by C-G formula based on SCr of 0.86 mg/dL).   Medical History: Past Medical History:  Diagnosis Date   Abnormal Pap smear of cervix    Asthma    Dyspnea    GERD (gastroesophageal reflux disease)    Headache    Heart disease    cardial murmur   Hyperlipidemia    Hypertension    Mitral regurgitation    Trichomoniasis     Assessment: Pharmacy consulted to dose lovenox in patient with mechanical mitral valve who is unable to take PTA warfarin due to enteritis. Patient's INR on admission is subtherapeutic at 2.1 (2.5-3.5 goal).   INR up to 2.7, will stop lovenox bridge. Hemoglobin trending down. No bleeding issues noted.   Goal of Therapy:  Anti-Xa level 0.6-1 units/ml 4hrs after LMWH dose given INR goal 2.5-3.5 Monitor platelets by anticoagulation protocol: Yes   Plan:  Return to home dose of warfarin at discharge INR recheck in am if still admitted  Sheppard Coil PharmD., BCPS Clinical Pharmacist 07/08/2022 8:33 AM

## 2022-08-20 ENCOUNTER — Encounter (HOSPITAL_COMMUNITY): Payer: Self-pay | Admitting: *Deleted

## 2022-08-20 ENCOUNTER — Emergency Department (HOSPITAL_COMMUNITY)
Admission: EM | Admit: 2022-08-20 | Discharge: 2022-08-20 | Disposition: A | Discharge status: Home / Self Care | Payer: Medicaid Other | Attending: Emergency Medicine | Admitting: Emergency Medicine

## 2022-08-20 ENCOUNTER — Other Ambulatory Visit: Payer: Self-pay

## 2022-08-20 ENCOUNTER — Emergency Department (HOSPITAL_COMMUNITY): Payer: Medicaid Other

## 2022-08-20 DIAGNOSIS — R197 Diarrhea, unspecified: Secondary | ICD-10-CM | POA: Diagnosis not present

## 2022-08-20 DIAGNOSIS — R791 Abnormal coagulation profile: Secondary | ICD-10-CM | POA: Diagnosis not present

## 2022-08-20 DIAGNOSIS — Z7982 Long term (current) use of aspirin: Secondary | ICD-10-CM | POA: Diagnosis not present

## 2022-08-20 DIAGNOSIS — D72829 Elevated white blood cell count, unspecified: Secondary | ICD-10-CM | POA: Diagnosis not present

## 2022-08-20 DIAGNOSIS — R1031 Right lower quadrant pain: Secondary | ICD-10-CM | POA: Insufficient documentation

## 2022-08-20 DIAGNOSIS — R110 Nausea: Secondary | ICD-10-CM

## 2022-08-20 DIAGNOSIS — Z91040 Latex allergy status: Secondary | ICD-10-CM | POA: Insufficient documentation

## 2022-08-20 DIAGNOSIS — R1032 Left lower quadrant pain: Secondary | ICD-10-CM | POA: Diagnosis not present

## 2022-08-20 DIAGNOSIS — Z7901 Long term (current) use of anticoagulants: Secondary | ICD-10-CM | POA: Insufficient documentation

## 2022-08-20 DIAGNOSIS — Z79899 Other long term (current) drug therapy: Secondary | ICD-10-CM | POA: Insufficient documentation

## 2022-08-20 DIAGNOSIS — R109 Unspecified abdominal pain: Secondary | ICD-10-CM

## 2022-08-20 DIAGNOSIS — R8289 Other abnormal findings on cytological and histological examination of urine: Secondary | ICD-10-CM | POA: Insufficient documentation

## 2022-08-20 DIAGNOSIS — R1011 Right upper quadrant pain: Secondary | ICD-10-CM | POA: Diagnosis not present

## 2022-08-20 DIAGNOSIS — R1084 Generalized abdominal pain: Secondary | ICD-10-CM | POA: Diagnosis present

## 2022-08-20 DIAGNOSIS — R112 Nausea with vomiting, unspecified: Secondary | ICD-10-CM | POA: Diagnosis not present

## 2022-08-20 LAB — URINALYSIS, ROUTINE W REFLEX MICROSCOPIC
Bacteria, UA: NONE SEEN
Bilirubin Urine: NEGATIVE
Glucose, UA: NEGATIVE mg/dL
Ketones, ur: 20 mg/dL — AB
Leukocytes,Ua: NEGATIVE
Nitrite: NEGATIVE
Protein, ur: 100 mg/dL — AB
Specific Gravity, Urine: 1.018 (ref 1.005–1.030)
pH: 9 — ABNORMAL HIGH (ref 5.0–8.0)

## 2022-08-20 LAB — I-STAT CHEM 8, ED
BUN: 6 mg/dL (ref 6–20)
Calcium, Ion: 1.19 mmol/L (ref 1.15–1.40)
Chloride: 104 mmol/L (ref 98–111)
Creatinine, Ser: 0.8 mg/dL (ref 0.44–1.00)
Glucose, Bld: 146 mg/dL — ABNORMAL HIGH (ref 70–99)
HCT: 46 % (ref 36.0–46.0)
Hemoglobin: 15.6 g/dL — ABNORMAL HIGH (ref 12.0–15.0)
Potassium: 3.7 mmol/L (ref 3.5–5.1)
Sodium: 141 mmol/L (ref 135–145)
TCO2: 28 mmol/L (ref 22–32)

## 2022-08-20 LAB — I-STAT BETA HCG BLOOD, ED (MC, WL, AP ONLY): I-stat hCG, quantitative: <5 m[IU]/mL (ref <5)

## 2022-08-20 LAB — COMPREHENSIVE METABOLIC PANEL
ALT: 18 U/L (ref 0–44)
AST: 24 U/L (ref 15–41)
Albumin: 4.5 g/dL (ref 3.5–5.0)
Alkaline Phosphatase: 82 U/L (ref 38–126)
Anion gap: 10 (ref 5–15)
BUN: 8 mg/dL (ref 6–20)
CO2: 23 mmol/L (ref 22–32)
Calcium: 9.3 mg/dL (ref 8.9–10.3)
Chloride: 103 mmol/L (ref 98–111)
Creatinine, Ser: 0.91 mg/dL (ref 0.44–1.00)
GFR, Estimated: >60 mL/min (ref >60)
Glucose, Bld: 145 mg/dL — ABNORMAL HIGH (ref 70–99)
Potassium: 3.6 mmol/L (ref 3.5–5.1)
Sodium: 136 mmol/L (ref 135–145)
Total Bilirubin: 1 mg/dL (ref 0.3–1.2)
Total Protein: 8.1 g/dL (ref 6.5–8.1)

## 2022-08-20 LAB — CBC
HCT: 42.1 % (ref 36.0–46.0)
Hemoglobin: 14.4 g/dL (ref 12.0–15.0)
MCH: 32 pg (ref 26.0–34.0)
MCHC: 34.2 g/dL (ref 30.0–36.0)
MCV: 93.6 fL (ref 80.0–100.0)
Platelets: 314 10*3/uL (ref 150–400)
RBC: 4.5 MIL/uL (ref 3.87–5.11)
RDW: 14.3 % (ref 11.5–15.5)
WBC: 11.5 10*3/uL — ABNORMAL HIGH (ref 4.0–10.5)
nRBC: 0 % (ref 0.0–0.2)

## 2022-08-20 LAB — PROTIME-INR
INR: 2.2 — ABNORMAL HIGH (ref 0.8–1.2)
Prothrombin Time: 24.2 seconds — ABNORMAL HIGH (ref 11.4–15.2)

## 2022-08-20 LAB — LIPASE, BLOOD: Lipase: 22 U/L (ref 11–51)

## 2022-08-20 MED ORDER — PROCHLORPERAZINE MALEATE 10 MG PO TABS: 10.0000 mg | ORAL_TABLET | Freq: Two times a day (BID) | ORAL | 0 refills | Status: AC | PRN

## 2022-08-20 MED ORDER — FENTANYL CITRATE PF 50 MCG/ML IJ SOSY
50.0000 ug | PREFILLED_SYRINGE | Freq: Once | INTRAMUSCULAR | Status: AC
  Administered 2022-08-20: 50 ug via INTRAVENOUS
  Filled 2022-08-20: qty 1

## 2022-08-20 MED ORDER — LORAZEPAM 2 MG/ML IJ SOLN
0.5000 mg | Freq: Once | INTRAMUSCULAR | Status: AC
  Administered 2022-08-20: 0.5 mg via INTRAVENOUS
  Filled 2022-08-20: qty 1

## 2022-08-20 MED ORDER — CARVEDILOL 12.5 MG PO TABS
12.5000 mg | ORAL_TABLET | ORAL | Status: AC
  Administered 2022-08-20: 12.5 mg via ORAL
  Filled 2022-08-20: qty 1

## 2022-08-20 MED ORDER — LACTATED RINGERS IV BOLUS
1000.0000 mL | Freq: Once | INTRAVENOUS | Status: AC
  Administered 2022-08-20: 1000 mL via INTRAVENOUS

## 2022-08-20 MED ORDER — DROPERIDOL 2.5 MG/ML IJ SOLN
1.2500 mg | Freq: Once | INTRAMUSCULAR | Status: AC
  Administered 2022-08-20: 1.25 mg via INTRAVENOUS
  Filled 2022-08-20: qty 2

## 2022-08-20 NOTE — Discharge Instructions (Signed)
You do not appear to need any new courses of antibiotics today. Please drink gatorade mixed half and half with water and drink sips every 10 minutes to pass urine at least every 3 hours Follow up with gi as scheduled on Monday. Return if you are unable to tolerate liquids or have worsening pain.

## 2022-08-20 NOTE — ED Notes (Addendum)
Patient's transportation is around 45 mins away. Due to weakness from vomiting, patient will remain in room until her transportation arrives.

## 2022-08-20 NOTE — ED Notes (Addendum)
Patient tolerated sprite and crackers, but communicated increasing nausea. MD notified. Nausea treated with ativan

## 2022-08-20 NOTE — ED Notes (Signed)
Sprite and crackers given for PO challenge

## 2022-08-20 NOTE — ED Provider Notes (Signed)
[facility] AT [facility] Provider Note   CSN: [id number] Arrival date & time: 08/20/22  9562     History {Add pertinent medical, surgical, social history, OB history to HPI:1} Chief Complaint  Patient presents with   Abdominal Pain    Anne Kirby is a 42 y.o. female.  HPI 42 year old female presents today complaining of nausea and vomiting.  She reports that she has had some diffuse abdominal pain.  She has been taking Zofran but has continued to have nausea and vomiting.  Reports that she has had episodes of this multiple times in the past.  She was most recently admitted 4 16-4 18 with enteritis. Patient is on Coumadin.  She reports taking her Coumadin at home.  She was not able to keep her blood pressure down today.  He has had some loose stools.  During her last admission patient was treated for presumed enterocolitis with Rocephin and Flagyl.    Home Medications Prior to Admission medications   Medication Sig Start Date End Date Taking? Authorizing Provider  albuterol (PROVENTIL) (2.5 MG/3ML) 0.083% nebulizer solution Take 3 mLs (2.5 mg total) by nebulization 3 (three) times daily. 07/08/22   Shon Hale, MD  albuterol (VENTOLIN HFA) 108 (90 Base) MCG/ACT inhaler Inhale 2 puffs into the lungs every 4 (four) hours as needed for wheezing or shortness of breath. 07/08/22   Shon Hale, MD  amLODipine (NORVASC) 10 MG tablet Take 0.5 tablets (5 mg total) by mouth daily. 07/08/22   Shon Hale, MD  aspirin 81 MG chewable tablet Chew 1 tablet (81 mg total) by mouth daily with breakfast. 07/08/22   Mariea Clonts, Courage, MD  atorvastatin (LIPITOR) 20 MG tablet Take 40 mg by mouth daily. 05/01/19   [provider]  carvedilol (COREG) 6.25 MG tablet Take 1 tablet (6.25 mg total) by mouth 2 (two) times daily with a meal. 07/08/22   Emokpae, Courage, MD  clonazePAM (KLONOPIN) 0.5 MG tablet Take 0.5 mg by mouth 2 (two) times daily as  needed for anxiety.    [provider]  cyclobenzaprine (FLEXERIL) 10 MG tablet Take 10 mg by mouth 3 (three) times daily as needed for muscle spasms.    [provider]  FLUTICASONE PROPIONATE, INHAL, IN Inhale into the lungs daily.    [provider]  gabapentin (NEURONTIN) 300 MG capsule Take 2 capsules (600 mg total) by mouth 2 (two) times daily. 07/08/22   Shon Hale, MD  ondansetron (ZOFRAN ODT) 4 MG disintegrating tablet Take 1 tablet (4 mg total) by mouth every 8 (eight) hours as needed for nausea or vomiting. 07/08/22   Shon Hale, MD  pantoprazole (PROTONIX) 40 MG tablet Take 1 tablet (40 mg total) by mouth daily. 07/08/22   Shon Hale, MD  SUMAtriptan (IMITREX) 100 MG tablet Take 1 tablet (100 mg total) by mouth every 2 (two) hours as needed for migraine or headache. May repeat in 2 hours if headache persists or recurs. 07/08/22   Shon Hale, MD  warfarin (COUMADIN) 4 MG tablet Take 1 tablet (4 mg total) by mouth See admin instructions. Take 6mg  (1.5 tablets) daily until seen for follow up in INR clinic. 07/08/22   Shon Hale, MD      Allergies    Bactrim [sulfamethoxazole-trimethoprim], Tramadol, and Latex    Review of Systems   Review of Systems  Physical Exam Updated Vital Signs BP (!) 193/122   Pulse 71   Temp 98.5 F (36.9 C) (Oral)  Resp 19   Ht 1.676 m (5\' 6" )   Wt 86.2 kg   LMP 08/13/2022 (Approximate)   SpO2 100%   BMI 30.67 kg/m  Physical Exam Vitals and nursing note reviewed.  Constitutional:      General: She is not in acute distress.    Appearance: She is well-developed. She is ill-appearing.  HENT:     Head: Normocephalic.     Mouth/Throat:     Mouth: Mucous membranes are moist.  Eyes:     Extraocular Movements: Extraocular movements intact.  Cardiovascular:     Rate and Rhythm: Normal rate and regular rhythm.     Heart sounds: Normal heart sounds.  Pulmonary:     Effort: Pulmonary effort is  normal.     Breath sounds: Normal breath sounds.  Abdominal:     General: Abdomen is flat. Bowel sounds are normal.     Palpations: Abdomen is soft.     Tenderness: There is abdominal tenderness in the right upper quadrant, right lower quadrant and left lower quadrant. There is no right CVA tenderness or guarding. Negative signs include Murphy's sign.  Skin:    General: Skin is warm and dry.  Neurological:     Mental Status: She is alert.     ED Results / Procedures / Treatments   Labs (all labs ordered are listed, but only abnormal results are displayed) Labs Reviewed  CBC - Abnormal; Notable for the following components:      Result Value   WBC 11.5 (*)    All other components within normal limits  COMPREHENSIVE METABOLIC PANEL - Abnormal; Notable for the following components:   Glucose, Bld 145 (*)    All other components within normal limits  URINALYSIS, ROUTINE W REFLEX MICROSCOPIC - Abnormal; Notable for the following components:   pH 9.0 (*)    Hgb urine dipstick MODERATE (*)    Ketones, ur 20 (*)    Protein, ur 100 (*)    All other components within normal limits  PROTIME-INR - Abnormal; Notable for the following components:   Prothrombin Time 24.2 (*)    INR 2.2 (*)    All other components within normal limits  I-STAT CHEM 8, ED - Abnormal; Notable for the following components:   Glucose, Bld 146 (*)    Hemoglobin 15.6 (*)    All other components within normal limits  I-STAT BETA HCG BLOOD, ED ([facility], [facility], [facility] ONLY) - Normal  C DIFFICILE QUICK SCREEN W PCR REFLEX    LIPASE, BLOOD  I-STAT BETA HCG BLOOD, ED ([facility], [facility], [facility] ONLY)    EKG EKG Interpretation  Date/Time:  Friday Aug 20 2022 08:17:25 EDT Ventricular Rate:  71 PR Interval:  194 QRS Duration: 76 QT Interval:  413 QTC Calculation: 449 R Axis:   66 Text Interpretation: Normal sinus rhythm Normal ECG Confirmed by Margarita Grizzle [phone]) on 08/20/2022 10:38:31 AM  Radiology No results  found.  Procedures Procedures  {Document cardiac monitor, telemetry assessment procedure when appropriate:1}  Medications Ordered in ED Medications  lactated ringers bolus 1,000 mL (0 mLs Intravenous Stopped 08/20/22 1133)  droperidol (INAPSINE) 2.5 MG/ML injection 1.25 mg (1.25 mg Intravenous Given 08/20/22 0912)  fentaNYL (SUBLIMAZE) injection 50 mcg (50 mcg Intravenous Given 08/20/22 1244)  carvedilol (COREG) tablet 12.5 mg (12.5 mg Oral Given 08/20/22 1309)    ED Course/ Medical Decision Making/ A&P Clinical Course as of 08/20/22 1314  Fri Aug 20, 2022  1036 CBC reviewed and interpreted with mild  leukocytosis 11,500 otherwise within normal limits [DR]  1037 Complete metabolic panel reviewed interpreted significant for hyperglycemia with glucose 145 otherwise within normal limits [DR]  1037 INR 2.2 consistent with patient's anticoagulation I-STAT pregnancy test reviewed interpreted within normal limits [DR]  1037 Urinalysis reviewed interpreted significant for 6-10 red blood cells, 0-5 white blood cells, no bacteria seen, trite negative [DR]    Clinical Course User Index [DR] Margarita Grizzle, MD   {   Click here for ABCD2, HEART and other calculatorsREFRESH Note before signing :1}                          Medical Decision Making Amount and/or Complexity of Data Reviewed Labs: ordered. Radiology: ordered.  Risk Prescription drug management.   Patient with history of nausea vomiting diarrhea and history of enteritis presents today with nausea vomiting and diarrhea.  Here in the ED she was evaluated with lab work.  She has a mild leukocytosis 11,500 otherwise CBC is within normal limits complete metabolic panel is significant for mild hyperglycemia otherwise within normal limits Urinalysis was reviewed and does not show evidence of infection.  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  Her INR is 2.2. Patient was given IV fluids here in the ED.  CT scanner is not  working at [facility].  MRI was obtained.  {Document critical care time when appropriate:1} {Document review of labs and clinical decision tools ie heart score, Chads2Vasc2 etc:1}  {Document your independent review of radiology images, and any outside records:1} {Document your discussion with family members, caretakers, and with consultants:1} {Document social determinants of health affecting pt's care:1} {Document your decision making why or why not admission, treatments were needed:1} Final Clinical Impression(s) / ED Diagnoses Final diagnoses:  None    Rx / DC Orders ED Discharge Orders     None

## 2022-08-20 NOTE — ED Triage Notes (Signed)
Pt c/o LLQ abdominal pain and vomiting x 3 days. Pt reports she has been taking Zofran but it hasn't been helping.

## 2022-08-20 NOTE — ED Notes (Signed)
Patient stated nausea had subsided and pain was much better than prior to arrival

## 2022-08-20 NOTE — ED Notes (Signed)
Patient given sprite and crackers for PO challenge

## 2022-08-20 NOTE — ED Notes (Signed)
BP assessed. BP meds to be taken as normally prescribed.

## 2023-03-24 ENCOUNTER — Encounter: Payer: Self-pay | Admitting: Behavioral Health

## 2023-03-28 ENCOUNTER — Other Ambulatory Visit: Payer: Self-pay | Admitting: Internal Medicine

## 2023-03-28 DIAGNOSIS — Z1231 Encounter for screening mammogram for malignant neoplasm of breast: Secondary | ICD-10-CM

## 2023-04-18 ENCOUNTER — Emergency Department (HOSPITAL_COMMUNITY)
Admission: EM | Admit: 2023-04-18 | Discharge: 2023-04-18 | Discharge status: Against Medical Advice | Payer: Medicaid Other | Attending: Emergency Medicine | Admitting: Emergency Medicine

## 2023-04-18 ENCOUNTER — Encounter (HOSPITAL_COMMUNITY): Payer: Self-pay

## 2023-04-18 ENCOUNTER — Other Ambulatory Visit: Payer: Self-pay

## 2023-04-18 DIAGNOSIS — Z5321 Procedure and treatment not carried out due to patient leaving prior to being seen by health care provider: Secondary | ICD-10-CM | POA: Diagnosis not present

## 2023-04-18 DIAGNOSIS — Z7901 Long term (current) use of anticoagulants: Secondary | ICD-10-CM | POA: Insufficient documentation

## 2023-04-18 DIAGNOSIS — Z760 Encounter for issue of repeat prescription: Secondary | ICD-10-CM | POA: Diagnosis present

## 2023-04-18 LAB — PROTIME-INR
INR: 1.2 (ref 0.8–1.2)
Prothrombin Time: 15.7 s — ABNORMAL HIGH (ref 11.4–15.2)

## 2023-04-18 NOTE — ED Triage Notes (Signed)
Pt presents pov wanting warfin due to not having it for x3 days. Pt states she is having trouble getting pharmacies to transfer prescriptions.

## 2024-01-27 ENCOUNTER — Encounter: Payer: Self-pay | Admitting: Internal Medicine

## 2024-02-09 ENCOUNTER — Encounter: Payer: Self-pay | Admitting: Internal Medicine
